# Patient Record
Sex: Male | Born: 1935 | Race: Black or African American | Hispanic: No | Smoking: Never smoker
Health system: Southern US, Community
[De-identification: ages and names within clinical notes are randomized; demographics above are authoritative.]

## PROBLEM LIST (undated history)

## (undated) DIAGNOSIS — E119 Type 2 diabetes mellitus without complications: Secondary | ICD-10-CM

## (undated) DIAGNOSIS — E7800 Pure hypercholesterolemia, unspecified: Secondary | ICD-10-CM

## (undated) DIAGNOSIS — I10 Essential (primary) hypertension: Secondary | ICD-10-CM

## (undated) HISTORY — PX: CHOLECYSTECTOMY: SHX55

---

## 2008-09-01 ENCOUNTER — Inpatient Hospital Stay: Payer: Self-pay | Admitting: Internal Medicine

## 2008-09-04 ENCOUNTER — Emergency Department: Payer: Self-pay | Admitting: Unknown Physician Specialty

## 2008-11-09 ENCOUNTER — Ambulatory Visit: Payer: Self-pay | Admitting: Gastroenterology

## 2012-01-23 LAB — CBC
MCV: 86 fL (ref 80–100)
Platelet: 139 10*3/uL — ABNORMAL LOW (ref 150–440)
RDW: 15.1 % — ABNORMAL HIGH (ref 11.5–14.5)

## 2012-01-23 LAB — URINALYSIS, COMPLETE
Bacteria: NONE SEEN
Bilirubin,UR: NEGATIVE
Glucose,UR: >=500 mg/dL (ref 0–75)
Ketone: NEGATIVE
Nitrite: NEGATIVE
Specific Gravity: 1.017 (ref 1.003–1.030)
Squamous Epithelial: NONE SEEN
WBC UR: 1 /HPF (ref 0–5)

## 2012-01-23 LAB — COMPREHENSIVE METABOLIC PANEL
Albumin: 3.6 g/dL (ref 3.4–5.0)
Anion Gap: 11 (ref 7–16)
BUN: 13 mg/dL (ref 7–18)
Calcium, Total: 9.5 mg/dL (ref 8.5–10.1)
Co2: 24 mmol/L (ref 21–32)
EGFR (African American): >60
EGFR (Non-African Amer.): 55 — ABNORMAL LOW
Potassium: 4.2 mmol/L (ref 3.5–5.1)

## 2012-01-24 ENCOUNTER — Inpatient Hospital Stay: Payer: Self-pay | Admitting: Surgery

## 2012-01-25 LAB — COMPREHENSIVE METABOLIC PANEL
Alkaline Phosphatase: 59 U/L (ref 50–136)
BUN: 18 mg/dL (ref 7–18)
Bilirubin,Total: 0.8 mg/dL (ref 0.2–1.0)
Chloride: 95 mmol/L — ABNORMAL LOW (ref 98–107)
Creatinine: 1.69 mg/dL — ABNORMAL HIGH (ref 0.60–1.30)
EGFR (African American): 45 — ABNORMAL LOW
EGFR (Non-African Amer.): 39 — ABNORMAL LOW
Glucose: 193 mg/dL — ABNORMAL HIGH (ref 65–99)
SGOT(AST): 22 U/L (ref 15–37)
SGPT (ALT): 19 U/L (ref 12–78)
Total Protein: 7 g/dL (ref 6.4–8.2)

## 2012-01-25 LAB — PROTIME-INR
INR: 1.4
Prothrombin Time: 17.7 secs — ABNORMAL HIGH (ref 11.5–14.7)

## 2012-01-25 LAB — CBC WITH DIFFERENTIAL/PLATELET
Basophil %: 0.1 %
Eosinophil %: 0.1 %
HCT: 37.5 % — ABNORMAL LOW (ref 40.0–52.0)
HGB: 12.3 g/dL — ABNORMAL LOW (ref 13.0–18.0)
Lymphocyte %: 7.5 %
Monocyte %: 6.2 %
Neutrophil #: 16.5 10*3/uL — ABNORMAL HIGH (ref 1.4–6.5)
Neutrophil %: 86.1 %
RBC: 4.37 10*6/uL — ABNORMAL LOW (ref 4.40–5.90)

## 2012-01-26 LAB — COMPREHENSIVE METABOLIC PANEL
Anion Gap: 12 (ref 7–16)
BUN: 21 mg/dL — ABNORMAL HIGH (ref 7–18)
Bilirubin,Total: 0.8 mg/dL (ref 0.2–1.0)
Chloride: 92 mmol/L — ABNORMAL LOW (ref 98–107)
Creatinine: 1.63 mg/dL — ABNORMAL HIGH (ref 0.60–1.30)
EGFR (African American): 47 — ABNORMAL LOW
Potassium: 3.8 mmol/L (ref 3.5–5.1)
SGPT (ALT): 15 U/L (ref 12–78)
Total Protein: 6.9 g/dL (ref 6.4–8.2)

## 2012-01-26 LAB — CBC WITH DIFFERENTIAL/PLATELET
Basophil #: 0 10*3/uL (ref 0.0–0.1)
Basophil %: 0.1 %
Eosinophil %: 0.6 %
Lymphocyte #: 1.1 10*3/uL (ref 1.0–3.6)
Lymphocyte %: 6.9 %
MCH: 28.6 pg (ref 26.0–34.0)
MCV: 85 fL (ref 80–100)
Monocyte %: 7.4 %
Neutrophil #: 13.8 10*3/uL — ABNORMAL HIGH (ref 1.4–6.5)
Platelet: 112 10*3/uL — ABNORMAL LOW (ref 150–440)
RBC: 4.02 10*6/uL — ABNORMAL LOW (ref 4.40–5.90)
RDW: 15.1 % — ABNORMAL HIGH (ref 11.5–14.5)
WBC: 16.3 10*3/uL — ABNORMAL HIGH (ref 3.8–10.6)

## 2012-01-27 LAB — CBC WITH DIFFERENTIAL/PLATELET
Basophil %: 0.3 %
Eosinophil %: 2.1 %
HGB: 10.4 g/dL — ABNORMAL LOW (ref 13.0–18.0)
MCH: 27 pg (ref 26.0–34.0)
MCV: 85 fL (ref 80–100)
Monocyte %: 9.3 %
Neutrophil %: 80.5 %
Platelet: 130 10*3/uL — ABNORMAL LOW (ref 150–440)
RBC: 3.84 10*6/uL — ABNORMAL LOW (ref 4.40–5.90)
WBC: 16.1 10*3/uL — ABNORMAL HIGH (ref 3.8–10.6)

## 2012-01-27 LAB — COMPREHENSIVE METABOLIC PANEL
Albumin: 2.3 g/dL — ABNORMAL LOW (ref 3.4–5.0)
Alkaline Phosphatase: 79 U/L (ref 50–136)
BUN: 27 mg/dL — ABNORMAL HIGH (ref 7–18)
Bilirubin,Total: 0.8 mg/dL (ref 0.2–1.0)
Creatinine: 1.76 mg/dL — ABNORMAL HIGH (ref 0.60–1.30)
EGFR (African American): 43 — ABNORMAL LOW
EGFR (Non-African Amer.): 37 — ABNORMAL LOW
Glucose: 155 mg/dL — ABNORMAL HIGH (ref 65–99)
SGOT(AST): 31 U/L (ref 15–37)
SGPT (ALT): 22 U/L (ref 12–78)
Total Protein: 6.7 g/dL (ref 6.4–8.2)

## 2012-01-27 LAB — BASIC METABOLIC PANEL
Anion Gap: 10 (ref 7–16)
BUN: 26 mg/dL — ABNORMAL HIGH (ref 7–18)
Chloride: 98 mmol/L (ref 98–107)
Creatinine: 1.63 mg/dL — ABNORMAL HIGH (ref 0.60–1.30)
EGFR (African American): 47 — ABNORMAL LOW
EGFR (Non-African Amer.): 41 — ABNORMAL LOW
Glucose: 170 mg/dL — ABNORMAL HIGH (ref 65–99)
Osmolality: 264 (ref 275–301)

## 2012-01-27 LAB — AMYLASE: Amylase: 38 U/L (ref 25–115)

## 2012-01-27 LAB — LIPASE, BLOOD: Lipase: 327 U/L (ref 73–393)

## 2012-01-28 LAB — RENAL FUNCTION PANEL
BUN: 28 mg/dL — ABNORMAL HIGH (ref 7–18)
Chloride: 99 mmol/L (ref 98–107)
Creatinine: 1.77 mg/dL — ABNORMAL HIGH (ref 0.60–1.30)
EGFR (African American): 43 — ABNORMAL LOW
EGFR (Non-African Amer.): 37 — ABNORMAL LOW
Glucose: 187 mg/dL — ABNORMAL HIGH (ref 65–99)
Osmolality: 269 (ref 275–301)
Potassium: 3.8 mmol/L (ref 3.5–5.1)
Sodium: 129 mmol/L — ABNORMAL LOW (ref 136–145)

## 2012-01-28 LAB — CBC WITH DIFFERENTIAL/PLATELET
Eosinophil #: 0.4 10*3/uL (ref 0.0–0.7)
Eosinophil %: 2.8 %
Lymphocyte #: 1 10*3/uL (ref 1.0–3.6)
MCH: 29.3 pg (ref 26.0–34.0)
MCHC: 34.3 g/dL (ref 32.0–36.0)
MCV: 86 fL (ref 80–100)
Monocyte #: 1.5 x10 3/mm — ABNORMAL HIGH (ref 0.2–1.0)
Neutrophil %: 78.7 %
Platelet: 136 10*3/uL — ABNORMAL LOW (ref 150–440)
RBC: 3.74 10*6/uL — ABNORMAL LOW (ref 4.40–5.90)
RDW: 15.3 % — ABNORMAL HIGH (ref 11.5–14.5)

## 2012-01-28 LAB — BASIC METABOLIC PANEL
BUN: 28 mg/dL — ABNORMAL HIGH (ref 7–18)
Chloride: 97 mmol/L — ABNORMAL LOW (ref 98–107)
Osmolality: 267 (ref 275–301)
Potassium: 3.7 mmol/L (ref 3.5–5.1)
Sodium: 128 mmol/L — ABNORMAL LOW (ref 136–145)

## 2012-01-28 LAB — PROTEIN / CREATININE RATIO, URINE
Protein, Random Urine: 199 mg/dL — ABNORMAL HIGH (ref 0–12)
Protein/Creat. Ratio: 1316 mg/gCREAT — ABNORMAL HIGH (ref 0–200)

## 2012-01-29 LAB — CBC WITH DIFFERENTIAL/PLATELET
Basophil #: 0 x10 3/mm 3 (ref 0.0–0.1)
Basophil %: 0.2 %
Eosinophil #: 0.4 x10 3/mm 3 (ref 0.0–0.7)
Eosinophil %: 2.8 %
HCT: 30.6 % — ABNORMAL LOW (ref 40.0–52.0)
HGB: 10.3 g/dL — ABNORMAL LOW (ref 13.0–18.0)
Lymphocyte %: 9.8 %
Lymphs Abs: 1.4 x10 3/mm 3 (ref 1.0–3.6)
MCH: 28.7 pg (ref 26.0–34.0)
MCHC: 33.7 g/dL (ref 32.0–36.0)
MCV: 85 fL (ref 80–100)
Monocyte #: 1.9 x10 3/mm — ABNORMAL HIGH (ref 0.2–1.0)
Monocyte %: 13.4 %
Neutrophil #: 10.4 x10 3/mm 3 — ABNORMAL HIGH (ref 1.4–6.5)
Neutrophil %: 73.8 %
Platelet: 169 x10 3/mm 3 (ref 150–440)
RBC: 3.6 x10 6/mm 3 — ABNORMAL LOW (ref 4.40–5.90)
RDW: 15.6 % — ABNORMAL HIGH (ref 11.5–14.5)
WBC: 14.1 x10 3/mm 3 — ABNORMAL HIGH (ref 3.8–10.6)

## 2012-01-29 LAB — COMPREHENSIVE METABOLIC PANEL WITH GFR
Albumin: 2.1 g/dL — ABNORMAL LOW (ref 3.4–5.0)
Alkaline Phosphatase: 133 U/L (ref 50–136)
Anion Gap: 13 (ref 7–16)
BUN: 28 mg/dL — ABNORMAL HIGH (ref 7–18)
Bilirubin,Total: 0.5 mg/dL (ref 0.2–1.0)
Calcium, Total: 7.8 mg/dL — ABNORMAL LOW (ref 8.5–10.1)
Chloride: 99 mmol/L (ref 98–107)
Co2: 19 mmol/L — ABNORMAL LOW (ref 21–32)
Creatinine: 1.74 mg/dL — ABNORMAL HIGH (ref 0.60–1.30)
EGFR (African American): 44 — ABNORMAL LOW
EGFR (Non-African Amer.): 38 — ABNORMAL LOW
Glucose: 171 mg/dL — ABNORMAL HIGH (ref 65–99)
Osmolality: 272 (ref 275–301)
Potassium: 3.4 mmol/L — ABNORMAL LOW (ref 3.5–5.1)
SGOT(AST): 30 U/L (ref 15–37)
SGPT (ALT): 30 U/L (ref 12–78)
Sodium: 131 mmol/L — ABNORMAL LOW (ref 136–145)
Total Protein: 6.6 g/dL (ref 6.4–8.2)

## 2012-01-30 LAB — COMPREHENSIVE METABOLIC PANEL
Anion Gap: 14 (ref 7–16)
BUN: 26 mg/dL — ABNORMAL HIGH (ref 7–18)
Calcium, Total: 7.9 mg/dL — ABNORMAL LOW (ref 8.5–10.1)
Creatinine: 1.79 mg/dL — ABNORMAL HIGH (ref 0.60–1.30)
EGFR (African American): 42 — ABNORMAL LOW
Glucose: 180 mg/dL — ABNORMAL HIGH (ref 65–99)
Potassium: 4.1 mmol/L (ref 3.5–5.1)
SGPT (ALT): 133 U/L — ABNORMAL HIGH (ref 12–78)
Sodium: 133 mmol/L — ABNORMAL LOW (ref 136–145)

## 2012-01-30 LAB — CBC WITH DIFFERENTIAL/PLATELET
Lymphocytes: 11 %
Metamyelocyte: 1 %
Platelet: 210 10*3/uL (ref 150–440)
Segmented Neutrophils: 79 %
Variant Lymphocyte - H1-Rlymph: 1 %
WBC: 13.4 10*3/uL — ABNORMAL HIGH (ref 3.8–10.6)

## 2012-01-31 LAB — CBC WITH DIFFERENTIAL/PLATELET
Eosinophil %: 2.9 %
Lymphocyte #: 1.3 10*3/uL (ref 1.0–3.6)
MCH: 28 pg (ref 26.0–34.0)
MCHC: 32.9 g/dL (ref 32.0–36.0)
MCV: 85 fL (ref 80–100)
Monocyte #: 1.2 x10 3/mm — ABNORMAL HIGH (ref 0.2–1.0)
Platelet: 258 10*3/uL (ref 150–440)
RBC: 3.53 10*6/uL — ABNORMAL LOW (ref 4.40–5.90)
RDW: 15.6 % — ABNORMAL HIGH (ref 11.5–14.5)

## 2012-01-31 LAB — COMPREHENSIVE METABOLIC PANEL
Bilirubin,Total: 3.6 mg/dL — ABNORMAL HIGH (ref 0.2–1.0)
Chloride: 103 mmol/L (ref 98–107)
Co2: 17 mmol/L — ABNORMAL LOW (ref 21–32)
EGFR (African American): 41 — ABNORMAL LOW
EGFR (Non-African Amer.): 35 — ABNORMAL LOW
SGOT(AST): 128 U/L — ABNORMAL HIGH (ref 15–37)
SGPT (ALT): 113 U/L — ABNORMAL HIGH (ref 12–78)

## 2012-02-01 LAB — COMPREHENSIVE METABOLIC PANEL
Albumin: 1.9 g/dL — ABNORMAL LOW (ref 3.4–5.0)
Alkaline Phosphatase: 312 U/L — ABNORMAL HIGH (ref 50–136)
Calcium, Total: 7.6 mg/dL — ABNORMAL LOW (ref 8.5–10.1)
Chloride: 105 mmol/L (ref 98–107)
EGFR (African American): 43 — ABNORMAL LOW
Glucose: 145 mg/dL — ABNORMAL HIGH (ref 65–99)
Potassium: 3.5 mmol/L (ref 3.5–5.1)
SGOT(AST): 91 U/L — ABNORMAL HIGH (ref 15–37)
SGPT (ALT): 99 U/L — ABNORMAL HIGH (ref 12–78)
Total Protein: 5.9 g/dL — ABNORMAL LOW (ref 6.4–8.2)

## 2012-02-02 ENCOUNTER — Inpatient Hospital Stay (HOSPITAL_COMMUNITY)
Admission: AD | Admit: 2012-02-02 | Discharge: 2012-02-16 | DRG: 870 | Disposition: E | Payer: Medicare Other | Source: Other Acute Inpatient Hospital | Attending: Pulmonary Disease | Admitting: Pulmonary Disease

## 2012-02-02 ENCOUNTER — Inpatient Hospital Stay (HOSPITAL_COMMUNITY): Payer: Medicare Other

## 2012-02-02 DIAGNOSIS — E119 Type 2 diabetes mellitus without complications: Secondary | ICD-10-CM | POA: Diagnosis present

## 2012-02-02 DIAGNOSIS — E872 Acidosis, unspecified: Secondary | ICD-10-CM

## 2012-02-02 DIAGNOSIS — G9340 Encephalopathy, unspecified: Secondary | ICD-10-CM | POA: Diagnosis present

## 2012-02-02 DIAGNOSIS — R4182 Altered mental status, unspecified: Secondary | ICD-10-CM

## 2012-02-02 DIAGNOSIS — Z515 Encounter for palliative care: Secondary | ICD-10-CM

## 2012-02-02 DIAGNOSIS — E875 Hyperkalemia: Secondary | ICD-10-CM | POA: Diagnosis present

## 2012-02-02 DIAGNOSIS — N19 Unspecified kidney failure: Secondary | ICD-10-CM

## 2012-02-02 DIAGNOSIS — Z66 Do not resuscitate: Secondary | ICD-10-CM | POA: Diagnosis present

## 2012-02-02 DIAGNOSIS — N179 Acute kidney failure, unspecified: Secondary | ICD-10-CM | POA: Diagnosis present

## 2012-02-02 DIAGNOSIS — A480 Gas gangrene: Secondary | ICD-10-CM | POA: Diagnosis present

## 2012-02-02 DIAGNOSIS — K560 Paralytic ileus: Secondary | ICD-10-CM

## 2012-02-02 DIAGNOSIS — G253 Myoclonus: Secondary | ICD-10-CM

## 2012-02-02 DIAGNOSIS — J9600 Acute respiratory failure, unspecified whether with hypoxia or hypercapnia: Secondary | ICD-10-CM

## 2012-02-02 DIAGNOSIS — R6521 Severe sepsis with septic shock: Secondary | ICD-10-CM | POA: Diagnosis present

## 2012-02-02 DIAGNOSIS — A419 Sepsis, unspecified organism: Principal | ICD-10-CM | POA: Diagnosis present

## 2012-02-02 DIAGNOSIS — G931 Anoxic brain damage, not elsewhere classified: Secondary | ICD-10-CM | POA: Diagnosis present

## 2012-02-02 DIAGNOSIS — Z79899 Other long term (current) drug therapy: Secondary | ICD-10-CM

## 2012-02-02 DIAGNOSIS — E46 Unspecified protein-calorie malnutrition: Secondary | ICD-10-CM | POA: Diagnosis present

## 2012-02-02 DIAGNOSIS — I469 Cardiac arrest, cause unspecified: Secondary | ICD-10-CM

## 2012-02-02 DIAGNOSIS — J690 Pneumonitis due to inhalation of food and vomit: Secondary | ICD-10-CM | POA: Diagnosis present

## 2012-02-02 DIAGNOSIS — Z7982 Long term (current) use of aspirin: Secondary | ICD-10-CM

## 2012-02-02 DIAGNOSIS — D72829 Elevated white blood cell count, unspecified: Secondary | ICD-10-CM | POA: Diagnosis present

## 2012-02-02 HISTORY — DX: Essential (primary) hypertension: I10

## 2012-02-02 HISTORY — DX: Type 2 diabetes mellitus without complications: E11.9

## 2012-02-02 HISTORY — DX: Pure hypercholesterolemia, unspecified: E78.00

## 2012-02-02 LAB — BASIC METABOLIC PANEL
Anion Gap: 10 (ref 7–16)
BUN: 22 mg/dL — ABNORMAL HIGH (ref 7–18)
Calcium, Total: 7.7 mg/dL — ABNORMAL LOW (ref 8.5–10.1)
EGFR (Non-African Amer.): 32 — ABNORMAL LOW
Glucose: 178 mg/dL — ABNORMAL HIGH (ref 65–99)
Osmolality: 282 (ref 275–301)

## 2012-02-02 LAB — COMPREHENSIVE METABOLIC PANEL
ALT: 63 U/L — ABNORMAL HIGH (ref 0–53)
Albumin: 1.7 g/dL — ABNORMAL LOW (ref 3.5–5.2)
Alkaline Phosphatase: 202 U/L — ABNORMAL HIGH (ref 39–117)
Anion Gap: 10 (ref 7–16)
BUN: 26 mg/dL — ABNORMAL HIGH (ref 6–23)
Bilirubin,Total: 1.4 mg/dL — ABNORMAL HIGH (ref 0.2–1.0)
Calcium, Total: 8 mg/dL — ABNORMAL LOW (ref 8.5–10.1)
Chloride: 107 mmol/L (ref 98–107)
Chloride: 101 mEq/L (ref 96–112)
Co2: 19 mmol/L — ABNORMAL LOW (ref 21–32)
EGFR (African American): 50 — ABNORMAL LOW
EGFR (Non-African Amer.): 43 — ABNORMAL LOW
Osmolality: 277 (ref 275–301)
Potassium: 3.9 mmol/L (ref 3.5–5.1)
Potassium: 3.6 mEq/L (ref 3.5–5.1)
Sodium: 136 mmol/L (ref 136–145)
Sodium: 134 mEq/L — ABNORMAL LOW (ref 135–145)
Total Bilirubin: 1.6 mg/dL — ABNORMAL HIGH (ref 0.3–1.2)
Total Protein: 4.8 g/dL — ABNORMAL LOW (ref 6.0–8.3)

## 2012-02-02 LAB — CK TOTAL AND CKMB (NOT AT ARMC)
CK, MB: 3.3 ng/mL (ref 0.3–4.0)
Relative Index: 1.4 (ref 0.0–2.5)
Total CK: 231 U/L (ref 7–232)

## 2012-02-02 LAB — CBC WITH DIFFERENTIAL/PLATELET
Basophils Absolute: 0 10*3/uL (ref 0.0–0.1)
Eosinophil %: 1.1 %
Eosinophils Relative: 1 % (ref 0–5)
HCT: 26.3 % — ABNORMAL LOW (ref 39.0–52.0)
Lymphocytes Relative: 5 % — ABNORMAL LOW (ref 12–46)
Lymphs Abs: 1 10*3/uL (ref 0.7–4.0)
MCH: 28.2 pg (ref 26.0–34.0)
MCV: 81.2 fL (ref 78.0–100.0)
Monocytes Relative: 2 % — ABNORMAL LOW (ref 3–12)
Neutro Abs: 19 10*3/uL — ABNORMAL HIGH (ref 1.7–7.7)
Neutrophil %: 80.8 %
Platelet: 357 10*3/uL (ref 150–440)
Platelets: 258 10*3/uL (ref 150–400)
RBC: 3.44 10*6/uL — ABNORMAL LOW (ref 4.40–5.90)
RBC: 3.24 MIL/uL — ABNORMAL LOW (ref 4.22–5.81)
RDW: 15.4 % (ref 11.5–15.5)
WBC: 20.3 10*3/uL — ABNORMAL HIGH (ref 3.8–10.6)
WBC: 20.6 10*3/uL — ABNORMAL HIGH (ref 4.0–10.5)

## 2012-02-02 LAB — PROTIME-INR: Prothrombin Time: 14.7 seconds (ref 11.6–15.2)

## 2012-02-02 LAB — TROPONIN I: Troponin-I: <0.02 ng/mL

## 2012-02-02 LAB — GLUCOSE, CAPILLARY: Glucose-Capillary: 169 mg/dL — ABNORMAL HIGH (ref 70–99)

## 2012-02-02 LAB — LIPASE, BLOOD: Lipase: 401 U/L — ABNORMAL HIGH (ref 73–393)

## 2012-02-02 LAB — POCT I-STAT 3, ART BLOOD GAS (G3+)
Bicarbonate: 18.9 mEq/L — ABNORMAL LOW (ref 20.0–24.0)
O2 Saturation: 98 %
TCO2: 20 mmol/L (ref 0–100)
pCO2 arterial: 33.4 mmHg — ABNORMAL LOW (ref 35.0–45.0)
pH, Arterial: 7.361 (ref 7.350–7.450)
pO2, Arterial: 99 mmHg (ref 80.0–100.0)

## 2012-02-02 LAB — CK: CK, Total: 77 U/L (ref 35–232)

## 2012-02-02 LAB — CK-MB: CK-MB: 1.1 ng/mL (ref 0.5–3.6)

## 2012-02-02 LAB — PHOSPHORUS: Phosphorus: 2.1 mg/dL — ABNORMAL LOW (ref 2.3–4.6)

## 2012-02-02 LAB — PRO B NATRIURETIC PEPTIDE: Pro B Natriuretic peptide (BNP): 2816 pg/mL — ABNORMAL HIGH (ref 0–125)

## 2012-02-02 MED ORDER — FLUCONAZOLE IN SODIUM CHLORIDE 200-0.9 MG/100ML-% IV SOLN
200.0000 mg | INTRAVENOUS | Status: DC
  Administered 2012-02-03 – 2012-02-06 (×4): 200 mg via INTRAVENOUS
  Filled 2012-02-02 (×5): qty 100

## 2012-02-02 MED ORDER — CHLORHEXIDINE GLUCONATE 0.12 % MT SOLN
OROMUCOSAL | Status: AC
  Administered 2012-02-02: 21:00:00
  Filled 2012-02-02: qty 15

## 2012-02-02 MED ORDER — SODIUM CHLORIDE 0.9 % IV SOLN
INTRAVENOUS | Status: DC
  Administered 2012-02-02: 20:00:00 via INTRAVENOUS

## 2012-02-02 MED ORDER — SODIUM CHLORIDE 0.9 % IV SOLN
25.0000 ug/h | INTRAVENOUS | Status: DC
  Administered 2012-02-02 – 2012-02-05 (×3): 50 ug/h via INTRAVENOUS
  Filled 2012-02-02 (×2): qty 50

## 2012-02-02 MED ORDER — NOREPINEPHRINE BITARTRATE 1 MG/ML IJ SOLN
2.0000 ug/min | INTRAVENOUS | Status: DC
  Administered 2012-02-02: 22 ug/min via INTRAVENOUS
  Filled 2012-02-02 (×2): qty 4

## 2012-02-02 MED ORDER — SODIUM CHLORIDE 0.9 % IV SOLN
INTRAVENOUS | Status: DC
  Administered 2012-02-02 – 2012-02-03 (×2): via INTRAVENOUS

## 2012-02-02 MED ORDER — VALPROATE SODIUM 500 MG/5ML IV SOLN: 10.0000 mg/kg/d | Freq: Two times a day (BID) | INTRAVENOUS | Status: DC

## 2012-02-02 MED ORDER — PROPOFOL 10 MG/ML IV EMUL
5.0000 ug/kg/min | INTRAVENOUS | Status: DC
  Administered 2012-02-02: 50 ug/kg/min via INTRAVENOUS
  Filled 2012-02-02: qty 100

## 2012-02-02 MED ORDER — VANCOMYCIN HCL 1000 MG IV SOLR
2500.0000 mg | Freq: Once | INTRAVENOUS | Status: AC
  Administered 2012-02-02: 2500 mg via INTRAVENOUS
  Filled 2012-02-02: qty 2500

## 2012-02-02 MED ORDER — LIDOCAINE HCL (CARDIAC) 20 MG/ML IV SOLN
INTRAVENOUS | Status: AC
  Filled 2012-02-02: qty 5

## 2012-02-02 MED ORDER — ETOMIDATE 2 MG/ML IV SOLN
INTRAVENOUS | Status: AC
  Filled 2012-02-02: qty 20

## 2012-02-02 MED ORDER — PANTOPRAZOLE SODIUM 40 MG IV SOLR
40.0000 mg | INTRAVENOUS | Status: DC
  Administered 2012-02-02 – 2012-02-07 (×6): 40 mg via INTRAVENOUS
  Filled 2012-02-02 (×7): qty 40

## 2012-02-02 MED ORDER — INSULIN ASPART 100 UNIT/ML ~~LOC~~ SOLN
2.0000 [IU] | SUBCUTANEOUS | Status: DC
  Administered 2012-02-02 (×2): 4 [IU] via SUBCUTANEOUS
  Administered 2012-02-03 (×2): 2 [IU] via SUBCUTANEOUS
  Administered 2012-02-03: 4 [IU] via SUBCUTANEOUS
  Administered 2012-02-03 – 2012-02-08 (×6): 2 [IU] via SUBCUTANEOUS

## 2012-02-02 MED ORDER — CHLORHEXIDINE GLUCONATE 0.12 % MT SOLN
15.0000 mL | Freq: Two times a day (BID) | OROMUCOSAL | Status: DC
  Administered 2012-02-02 – 2012-02-08 (×13): 15 mL via OROMUCOSAL
  Filled 2012-02-02 (×10): qty 15

## 2012-02-02 MED ORDER — CHLORHEXIDINE GLUCONATE 0.12 % MT SOLN
15.0000 mL | Freq: Two times a day (BID) | OROMUCOSAL | Status: DC
  Administered 2012-02-02: 15 mL via OROMUCOSAL

## 2012-02-02 MED ORDER — SODIUM CHLORIDE 0.9 % IV SOLN: 500.0000 mg | Freq: Two times a day (BID) | INTRAVENOUS | Status: DC

## 2012-02-02 MED ORDER — NOREPINEPHRINE BITARTRATE 1 MG/ML IJ SOLN
2.0000 ug/min | INTRAVENOUS | Status: DC
  Administered 2012-02-03: 22 ug/min via INTRAVENOUS
  Administered 2012-02-03: 10 ug/min via INTRAVENOUS
  Administered 2012-02-04: 9 ug/min via INTRAVENOUS
  Administered 2012-02-05: 3 ug/min via INTRAVENOUS
  Filled 2012-02-02 (×4): qty 16

## 2012-02-02 MED ORDER — VANCOMYCIN HCL 1000 MG IV SOLR
1500.0000 mg | INTRAVENOUS | Status: DC
  Administered 2012-02-03: 1500 mg via INTRAVENOUS
  Filled 2012-02-02 (×2): qty 1500

## 2012-02-02 MED ORDER — SUCCINYLCHOLINE CHLORIDE 20 MG/ML IJ SOLN
INTRAMUSCULAR | Status: AC
  Filled 2012-02-02: qty 1

## 2012-02-02 MED ORDER — VANCOMYCIN HCL 1000 MG IV SOLR
2500.0000 mg | Freq: Once | INTRAVENOUS | Status: DC
  Filled 2012-02-02: qty 2500

## 2012-02-02 MED ORDER — SODIUM CHLORIDE 0.9 % IV SOLN
1000.0000 mg | Freq: Once | INTRAVENOUS | Status: AC
  Administered 2012-02-02: 1000 mg via INTRAVENOUS
  Filled 2012-02-02: qty 10

## 2012-02-02 MED ORDER — BIOTENE DRY MOUTH MT LIQD: 15.0000 mL | Freq: Two times a day (BID) | OROMUCOSAL | Status: DC

## 2012-02-02 MED ORDER — BIOTENE DRY MOUTH MT LIQD
15.0000 mL | Freq: Four times a day (QID) | OROMUCOSAL | Status: DC
  Administered 2012-02-02 – 2012-02-08 (×24): 15 mL via OROMUCOSAL

## 2012-02-02 MED ORDER — PIPERACILLIN-TAZOBACTAM 3.375 G IVPB
3.3750 g | Freq: Three times a day (TID) | INTRAVENOUS | Status: DC
  Administered 2012-02-02 – 2012-02-06 (×11): 3.375 g via INTRAVENOUS
  Filled 2012-02-02 (×13): qty 50

## 2012-02-02 MED ORDER — SODIUM CHLORIDE 0.9 % IV SOLN
1.0000 mg/h | INTRAVENOUS | Status: DC
  Administered 2012-02-02: 10 mg/h via INTRAVENOUS
  Administered 2012-02-03 – 2012-02-05 (×9): 8 mg/h via INTRAVENOUS
  Filled 2012-02-02 (×10): qty 10

## 2012-02-02 MED ORDER — ROCURONIUM BROMIDE 50 MG/5ML IV SOLN
INTRAVENOUS | Status: AC
  Administered 2012-02-02: 100 mg
  Filled 2012-02-02: qty 2

## 2012-02-02 MED ORDER — FLUCONAZOLE IN SODIUM CHLORIDE 400-0.9 MG/200ML-% IV SOLN
400.0000 mg | Freq: Once | INTRAVENOUS | Status: DC
  Administered 2012-02-02: 400 mg via INTRAVENOUS
  Filled 2012-02-02: qty 200

## 2012-02-02 NOTE — Procedures (Signed)
Arterial Catheter Insertion Procedure Note Trapper Meech [id number] 03-05-1936  Procedure: Insertion of Arterial Catheter  Indications: Blood pressure monitoring  Procedure Details Consent: Risks of procedure as well as the alternatives and risks of each were explained to the (patient/caregiver).  Consent for procedure obtained. Time Out: Verified patient identification, verified procedure, site/side was marked, verified correct patient position, special equipment/implants available, medications/allergies/relevent history reviewed, required imaging and test results available.  Performed  Maximum sterile technique was used including antiseptics, cap, gloves, gown, hand hygiene, mask and sheet. Skin prep: Chlorhexidine; local anesthetic administered 20 gauge catheter was inserted into right radial artery using the Seldinger technique.  Evaluation Blood flow good; BP tracing good. Complications: No apparent complications.   YACOUB,WESAM 02/10/2012

## 2012-02-02 NOTE — Progress Notes (Signed)
MEDICATION RELATED CONSULT NOTE - INITIAL   Pharmacy Consult for Valproate Indication: Myoclonus of face  Allergies  Allergen Reactions  . Ace Inhibitors Swelling    Patient Measurements: Height: 6\' 3"  (190.5 cm) Weight: 258 lb 9.6 oz (117.3 kg) IBW/kg (Calculated) : 84.5   Vital Signs: Temp: 97.7 F (36.5 C) (11/17 1930) Temp src: Oral (11/17 1930) BP: 114/64 mmHg (11/17 2311) Pulse Rate: 59  (11/17 2311) Intake/Output from previous day:   Intake/Output from this shift: Total I/O In: 894.3 [I.V.:559.3; IV Piggyback:335] Out: 620 [Urine:20; Emesis/NG output:600]  Labs:  [facility] 02/14/2012 1820  WBC 20.6*  HGB 9.1*  HCT 26.3*  PLT 258  APTT 28  CREATININE 2.44*  LABCREA --  CREATININE 2.44*  CREAT24HRUR --  MG 2.0  PHOS 2.1*  ALBUMIN 1.7*  PROT 4.8*  ALBUMIN 1.7*  AST 49*  ALT 63*  ALKPHOS 202*  BILITOT 1.6*  BILIDIR --  IBILI --   Estimated Creatinine Clearance: 36.7 ml/min (by C-G formula based on Cr of 2.44).   Microbiology: Recent Results (from the past 720 hour(s))  MRSA PCR SCREENING     Status: Normal   Collection Time   01/25/2012  5:56 PM      Component Value Range Status Comment   MRSA by PCR NEGATIVE  NEGATIVE Final     Medical History: No past medical history on file.  Medications:  Scheduled:    . antiseptic oral rinse  15 mL Mouth Rinse QID  . chlorhexidine  15 mL Mouth Rinse BID  . [COMPLETED] chlorhexidine      . etomidate      . [COMPLETED] fluconazole (DIFLUCAN) IV  400 mg Intravenous Once   Followed by  . fluconazole (DIFLUCAN) IV  200 mg Intravenous Q24H  . insulin aspart  2-6 Units Subcutaneous Q4H  . [COMPLETED] levetiracetam  1,000 mg Intravenous Once  . levetiracetam  500 mg Intravenous Q12H  . lidocaine (cardiac) 100 mg/43ml      . pantoprazole (PROTONIX) IV  40 mg Intravenous Q24H  . piperacillin-tazobactam (ZOSYN)  IV  3.375 g Intravenous Q8H  . [COMPLETED] rocuronium      . succinylcholine      . valproate  sodium  10 mg/kg/day Intravenous Q12H  . vancomycin  1,500 mg Intravenous Q24H  . vancomycin  2,500 mg Intravenous Once  . vancomycin  2,500 mg Intravenous Once  . [DISCONTINUED] antiseptic oral rinse  15 mL Mouth Rinse q12n4p  . [DISCONTINUED] chlorhexidine  15 mL Mouth Rinse BID    Assessment: 76 yo male known to pharmacy from antimicrobial management. Pharmacy now consulted to manage valproate for myoclonic activity of face despite receiving Keppra.   Goal of Therapy:  Valproate trough 50-100 mcg/mL  Plan:  1. Valproate 1750mg  x 1, then 400mg  IV Q8H 2. Consider valproate level once at steady-state (~4 days)  Emeline Gins 01/24/2012,11:57 PM

## 2012-02-02 NOTE — H&P (Signed)
Name: Jeremiah Rivera MRN: [id number] DOB: 1936/01/12    LOS: 0  Referring Provider:  [facility] surgery. Reason for Referral:  Cardiac arrest and respiratory failure  PULMONARY / CRITICAL CARE MEDICINE  HPI:  76 year old presented to [facility] 10 days ago with a gangrenous galbladder that was removed followed by ERCP.  The patient was admitted to the floor where he had a presumed aspiration event and cardiac arrest.  Code x12 minutes with subsequent myoclonus.  At the family's request patient was transferred to [facility].  No past medical history on file. No past surgical history on file. Prior to Admission medications   Medication Sig Start Date End Date Taking? Authorizing Provider  Amlodipine-Valsartan-HCTZ (EXFORGE HCT) 10-320-25 MG TABS Take 1 tablet by mouth daily.   Yes Historical Provider, MD  aspirin 81 MG tablet Take 81 mg by mouth daily.   Yes Historical Provider, MD  carvedilol (COREG) 25 MG tablet Take 50 mg by mouth 2 (two) times daily with a meal.    Yes Historical Provider, MD  chlorproMAZINE (THORAZINE) 50 MG tablet Take 50 mg by mouth 3 (three) times daily.   Yes Historical Provider, MD  cloNIDine (CATAPRES) 0.2 MG tablet Take 0.2 mg by mouth 2 (two) times daily.   Yes Historical Provider, MD  pioglitazone-metformin (ACTOPLUS MET) 15-850 MG per tablet Take 1 tablet by mouth 3 (three) times daily.   Yes Historical Provider, MD  sitaGLIPtin (JANUVIA) 50 MG tablet Take 50 mg by mouth daily.   Yes Historical Provider, MD   Allergies Allergies  Allergen Reactions  . Ace Inhibitors Swelling    Family History No family history on file. Social History  does not have a smoking history on file. He does not have any smokeless tobacco history on file. His alcohol and drug histories not on file.  Review Of Systems:  Unattainable  Events Since Admission: Cardiac arrest 11/17 Transfer to [facility] 11/17  Current Status:  Vital Signs: FiO2 (%):  [100 %] 100 % (11/17 1729) Weight:  [117.3 kg  (258 lb 9.6 oz)] 117.3 kg (258 lb 9.6 oz) (11/17 1729)  Physical Examination: General:  Obese male with distended abdomen. Neuro:  Myoclonus, no corneal, minimal gag, no dolls eye. HEENT:  [place]/AT, EOM-I spontaneous with myoclonus. Neck:  Supple, -LAN and -thyromegally. Cardiovascular:  RRR, Nl S1/S2, -M/R/G. Lungs:  Coarse BS diffusely. Abdomen:  Distended, hypertympanic, -BS. Musculoskeletal:  -edema and -tenderness. Skin:  Intact.  Active Problems:  Acute respiratory failure  Metabolic acidosis  Anoxic brain injury  Altered mental status  Cardiac arrest  Myoclonus  Renal failure   ASSESSMENT AND PLAN  PULMONARY  Lab February 29, 2012 1812  PHART 7.361  PCO2ART 33.4*  PO2ART 99.0  HCO3 18.9*  O2SAT 98.0   Ventilator Settings: Vent Mode:  [-] PRVC FiO2 (%):  [100 %] 100 % Set Rate:  [16 bmp] 16 bmp Vt Set:  [580 mL] 580 mL PEEP:  [5 cmH20-10 cmH20] 10 cmH20 CXR:  ET tube ok, TLC OK. ETT:  11/17>>>  A:  Respiratory failure secondary to aspiration resulting in cardiac arrest. P:   Full vent support. Increase PEEP to 10, PaO2 of 100 on 100%. Suction and lavage as needed. ABG and CXR in AM.  CARDIOVASCULAR No results found for this basename: TROPONINI:5,LATICACIDVEN:5, O2SATVEN:5,PROBNP:5 in the last 168 hours ECG:  Pending. Lines: R radial a-line 11/17>>>  L [place] TLC 11/17>>>  A: Shock due to propofol requiring low dose dopamine, hypertensive off propofol but will attempt to  keep on propofol until evaluated by neuro. P:  Propofol as discussed above. CVP. IVF. 2D echo. EKG. Not a candidate for hypothermia given sepsis and presumed evolving septic shock from abdominal source as well as aspiration.  RENAL No results found for this basename: NA:5,K:2,CL:5,CO2:5,BUN:5,CREATININE:5,CALCIUM:5,MG:5,PHOS:5 in the last 168 hours Intake/Output    None    Foley:  In place from [facility].  A:  Acute renal failure presumed, baseline unknown. P:   Monitor electrolytes. Replace  as needed. Gentle hydration.  GASTROINTESTINAL No results found for this basename: AST:5,ALT:5,ALKPHOS:5,BILITOT:5,PROT:5,ALBUMIN:5 in the last 168 hours  A:  Distended post a gangrenous galbladder. P:   Surgery's input appreciated. AXR noted with ileus. Will keep NPO. Monitor.  HEMATOLOGIC  Lab 2012-02-15 1820  HGB 9.1*  HCT 26.3*  PLT 258  INR --  APTT --   A:  Leukocytosis likely from sepsis. P:  Monitor CBC No need for transfusion at this time.  INFECTIOUS  Lab 2012/02/15 1820  WBC 20.6*  PROCALCITON --   Cultures: Blood 11/17>>> Urine 11/17>>> Sputum 11/17>>> Antibiotics: Vanc 11/17>>> Zosyn 11/17>>> Diflucan 11/17>>>  A:  Abdominal source and aspiration PNA. P:   Abx as above. Will tailor for cultures. If BP worsen will consider sepsis protocol, will attempt to avoid that for now to avoid over hydration.  ENDOCRINE No results found for this basename: GLUCAP:5 in the last 168 hours A:  Diabetes history.   P:   ISS CBG  NEUROLOGIC  A:  Likely severe anoxic injury. P:   Will CT in AM pending neuro evaluation. EEG pending neuro evaluation.  BEST PRACTICE / DISPOSITION Level of Care:  ICU Primary Service:  PCCM Consultants:  Surgery and Neuro Code Status:  Full Diet:  NPO DVT Px:  SCD's until neuro status is evaluated.  Head CT from [facility] on 11/17 is negative. GI Px:  Protonix. Skin Integrity:  Intact. Social / Family:  Family updated extensively and all questions answered.  CC time 85 min.  Koren Bound, M.D. Pulmonary and Critical Care Medicine [facility] Pager: [phone]  02-15-12, 6:58 PM

## 2012-02-02 NOTE — Procedures (Signed)
Central Venous Catheter Insertion Procedure Note Audric Weidner [id number] 04-06-1935  Procedure: Insertion of Central Venous Catheter Indications: Assessment of intravascular volume, Drug and/or fluid administration and Frequent blood sampling  Procedure Details Consent: Risks of procedure as well as the alternatives and risks of each were explained to the (patient/caregiver).  Consent for procedure obtained. Time Out: Verified patient identification, verified procedure, site/side was marked, verified correct patient position, special equipment/implants available, medications/allergies/relevent history reviewed, required imaging and test results available.  Performed  Maximum sterile technique was used including antiseptics, cap, gloves, gown, hand hygiene, mask and sheet. Skin prep: Chlorhexidine; local anesthetic administered A antimicrobial bonded/coated triple lumen catheter was placed in the left subclavian vein using the Seldinger technique.  Evaluation Blood flow good Complications: No apparent complications Patient did tolerate procedure well. Chest X-ray ordered to verify placement.  CXR: pending.  U/S used in placement, picture in chart.  Britanny Marksberry 01/22/2012, 6:07 PM

## 2012-02-02 NOTE — Progress Notes (Signed)
eLink Physician-Brief Progress Note Patient Name: Jeremiah Rivera DOB: 1935-10-15 MRN: [id number]  Date of Service  02/05/2012   HPI/Events of Note   Myoclonic activity - twitching of left face continues inspite of loading with keppra & 8 mg versed  eICU Interventions  Add valproate EEG   Intervention Category Major Interventions: Seizures - evaluation and management  Lenin Kuhnle V. 01/26/2012, 11:44 PM

## 2012-02-02 NOTE — Consult Note (Signed)
Reason for Consult:abdominal distension s/p lap chole at [facility] Referring Physician: Mikie Misner is an 76 y.o. male.  HPI: Pt 3 days s/p lap chole and post op ERCP  For CBD stone from [place]. Had an aspiration event and PEA cardiac arrest at [facility] for unknown time.  Intubated  At this point on pressors.  No test to review.  NGT in place.   Labs pending.  No past medical history on file.  No past surgical history on file.  No family history on file.  Social History:  does not have a smoking history on file. He does not have any smokeless tobacco history on file. His alcohol and drug histories not on file.  Allergies:  Allergies  Allergen Reactions  . Ace Inhibitors Swelling    Medications: I have reviewed the patient's current medications.  Results for orders placed during the hospital encounter of 01/23/2012 (from the past 48 hour(s))  POCT I-STAT 3, BLOOD GAS (G3+)     Status: Abnormal   Collection Time   02/10/2012  6:12 PM      Component Value Range Comment   pH, Arterial 7.361  7.350 - 7.450    pCO2 arterial 33.4 (*) 35.0 - 45.0 mmHg    pO2, Arterial 99.0  80.0 - 100.0 mmHg    Bicarbonate 18.9 (*) 20.0 - 24.0 mEq/L    TCO2 20  0 - 100 mmol/L    O2 Saturation 98.0      Acid-base deficit 6.0 (*) 0.0 - 2.0 mmol/L    Patient temperature 98.2 F      Collection site RADIAL, ALLEN'S TEST ACCEPTABLE      Drawn by Operator      Sample type ARTERIAL       No results found.  Review of Systems  Unable to perform ROS  Weight 258 lb 9.6 oz (117.3 kg). Physical Exam  Constitutional:       Intubated   HENT:  Head: Normocephalic and atraumatic.  Cardiovascular: Normal rate and regular rhythm.   Respiratory: Effort normal and breath sounds normal.  GI: He exhibits distension.       Distended but not rigid.  JP drain in place showing serosanguinous   No bile, stool or succus.  quiet  Skin: Skin is warm and dry.    Assessment/Plan: Abdominal distension with  PEA after probable aspiration.  CT not useful without contrast but cr 2 and has already had contrast at [facility] and this bumped his cr.  The pt will have free air and fluid at this point.  The drain is probably the best indicator of intraabdominal issues.  The pt had a history of abdominal distension prior to surgery.  Continue supportive care for now.  The distension is not new and is exacerbated secondary to CPR and PEA event.  May benefit from imaging at some point but given history of aspiration this is probably the event.  Will follow for now.  Shantal Roan A. 01/27/2012, 6:39 PM

## 2012-02-02 NOTE — Progress Notes (Signed)
ANTIBIOTIC CONSULT NOTE - INITIAL  Pharmacy Consult for Vancomycin/Zosyn/Diflucan Indication: pna vs. Asp pna. Vs abd infection  Allergies  Allergen Reactions  . Ace Inhibitors Swelling    Patient Measurements: Height: 6\' 3"  (190.5 cm) Weight: 258 lb 9.6 oz (117.3 kg) IBW/kg (Calculated) : 84.5   Vital Signs:   Intake/Output from previous day:   Intake/Output from this shift:    Labs:  Basename 01/26/2012 1820  WBC 20.6*  HGB 9.1*  PLT 258  LABCREA --  CREATININE 2.44*   Estimated Creatinine Clearance: 36.7 ml/min (by C-G formula based on Cr of 2.44). No results found for this basename: VANCOTROUGH:2,VANCOPEAK:2,VANCORANDOM:2,GENTTROUGH:2,GENTPEAK:2,GENTRANDOM:2,TOBRATROUGH:2,TOBRAPEAK:2,TOBRARND:2,AMIKACINPEAK:2,AMIKACINTROU:2,AMIKACIN:2, in the last 72 hours   Microbiology: No results found for this or any previous visit (from the past 720 hour(s)).  Medical History: No past medical history on file.  Medications:  Scheduled:    . etomidate      . lidocaine (cardiac) 100 mg/73ml      . pantoprazole (PROTONIX) IV  40 mg Intravenous Q24H  . rocuronium      . succinylcholine       Infusions:    . sodium chloride    . fentaNYL infusion INTRAVENOUS    . norepinephrine (LEVOPHED) Adult infusion    . propofol     Assessment: 76 y/o male patient transferred from [place] s/p aspiration and cardiac arrest event requiring broad spectrum antibiotics for r/o pna vs intra-abdominal infection. Noted elevated scr, will adjust abx doses.  Goal of Therapy:  Vancomycin trough level 15-20 mcg/ml  Plan:  Vancomycin 2500mg  IV load x1 then 1500mg  IV q24, zosyn 3.375g IV q24 and diflucan 400mg  IV x1 then 200mg  IV q24h. Will monitor renal function and f/u c&s. Measure antibiotic drug levels at steady state  [place], [place] M 02/13/2012,7:11 PM

## 2012-02-03 ENCOUNTER — Inpatient Hospital Stay (HOSPITAL_COMMUNITY): Payer: Medicare Other

## 2012-02-03 ENCOUNTER — Encounter (HOSPITAL_COMMUNITY): Payer: Self-pay | Admitting: *Deleted

## 2012-02-03 LAB — CORTISOL: Cortisol, Plasma: 23.2 ug/dL

## 2012-02-03 LAB — CBC
HCT: 25.2 % — ABNORMAL LOW (ref 39.0–52.0)
HCT: 23.4 % — ABNORMAL LOW (ref 39.0–52.0)
Hemoglobin: 8.6 g/dL — ABNORMAL LOW (ref 13.0–17.0)
MCHC: 34.1 g/dL (ref 30.0–36.0)
MCHC: 35.5 g/dL (ref 30.0–36.0)
MCV: 80.5 fL (ref 78.0–100.0)
MCV: 79.6 fL (ref 78.0–100.0)
Platelets: 170 10*3/uL (ref 150–400)
RDW: 15.6 % — ABNORMAL HIGH (ref 11.5–15.5)
WBC: 26.3 10*3/uL — ABNORMAL HIGH (ref 4.0–10.5)
WBC: 23.9 10*3/uL — ABNORMAL HIGH (ref 4.0–10.5)

## 2012-02-03 LAB — POCT I-STAT 3, ART BLOOD GAS (G3+)
Acid-base deficit: 8 mmol/L — ABNORMAL HIGH (ref 0.0–2.0)
Bicarbonate: 18 mEq/L — ABNORMAL LOW (ref 20.0–24.0)
Bicarbonate: 17.7 mEq/L — ABNORMAL LOW (ref 20.0–24.0)
O2 Saturation: 82 %
TCO2: 19 mmol/L (ref 0–100)
TCO2: 19 mmol/L (ref 0–100)
pCO2 arterial: 31.6 mmHg — ABNORMAL LOW (ref 35.0–45.0)
pCO2 arterial: 34.6 mmHg — ABNORMAL LOW (ref 35.0–45.0)
pH, Arterial: 7.365 (ref 7.350–7.450)
pO2, Arterial: 49 mmHg — ABNORMAL LOW (ref 80.0–100.0)

## 2012-02-03 LAB — GLUCOSE, CAPILLARY
Glucose-Capillary: 136 mg/dL — ABNORMAL HIGH (ref 70–99)
Glucose-Capillary: 114 mg/dL — ABNORMAL HIGH (ref 70–99)
Glucose-Capillary: 132 mg/dL — ABNORMAL HIGH (ref 70–99)

## 2012-02-03 LAB — COMPREHENSIVE METABOLIC PANEL
AST: 50 U/L — ABNORMAL HIGH (ref 0–37)
Albumin: 1.6 g/dL — ABNORMAL LOW (ref 3.5–5.2)
BUN: 31 mg/dL — ABNORMAL HIGH (ref 6–23)
Chloride: 105 mEq/L (ref 96–112)
Creatinine, Ser: 3.23 mg/dL — ABNORMAL HIGH (ref 0.50–1.35)
Potassium: 3.8 mEq/L (ref 3.5–5.1)
Total Protein: 5.4 g/dL — ABNORMAL LOW (ref 6.0–8.3)

## 2012-02-03 LAB — BASIC METABOLIC PANEL
BUN: 27 mg/dL — ABNORMAL HIGH (ref 6–23)
Chloride: 100 mEq/L (ref 96–112)
Creatinine, Ser: 2.64 mg/dL — ABNORMAL HIGH (ref 0.50–1.35)
Glucose, Bld: 200 mg/dL — ABNORMAL HIGH (ref 70–99)
Potassium: 3.9 mEq/L (ref 3.5–5.1)

## 2012-02-03 LAB — TROPONIN I
Troponin I: 0.35 ng/mL (ref <0.30)
Troponin I: 0.63 ng/mL (ref <0.30)

## 2012-02-03 LAB — POCT I-STAT, CHEM 8
BUN: 28 mg/dL — ABNORMAL HIGH (ref 6–23)
Chloride: 106 mEq/L (ref 96–112)
HCT: 27 % — ABNORMAL LOW (ref 39.0–52.0)
Potassium: 4.3 mEq/L (ref 3.5–5.1)
Sodium: 137 mEq/L (ref 135–145)

## 2012-02-03 MED ORDER — SODIUM CHLORIDE 0.9 % IV SOLN
1000.0000 mg | Freq: Once | INTRAVENOUS | Status: AC
  Administered 2012-02-03: 1000 mg via INTRAVENOUS
  Filled 2012-02-03: qty 10

## 2012-02-03 MED ORDER — SODIUM CHLORIDE 0.9 % IV SOLN
1500.0000 mg | Freq: Two times a day (BID) | INTRAVENOUS | Status: DC
  Administered 2012-02-03 – 2012-02-08 (×10): 1500 mg via INTRAVENOUS
  Filled 2012-02-03 (×11): qty 15

## 2012-02-03 MED ORDER — VALPROATE SODIUM 500 MG/5ML IV SOLN
400.0000 mg | Freq: Three times a day (TID) | INTRAVENOUS | Status: DC
  Administered 2012-02-03 – 2012-02-04 (×4): 400 mg via INTRAVENOUS
  Filled 2012-02-03 (×5): qty 4

## 2012-02-03 MED ORDER — VALPROATE SODIUM 500 MG/5ML IV SOLN
1750.0000 mg | Freq: Once | INTRAVENOUS | Status: AC
  Administered 2012-02-03: 1750 mg via INTRAVENOUS
  Filled 2012-02-03: qty 17.5

## 2012-02-03 MED ORDER — SODIUM CHLORIDE 0.9 % IV SOLN: 1000.0000 mg | Freq: Two times a day (BID) | INTRAVENOUS | Status: DC

## 2012-02-03 MED ORDER — VASOPRESSIN 20 UNIT/ML IJ SOLN
0.0300 [IU]/min | INTRAVENOUS | Status: DC
  Administered 2012-02-03 – 2012-02-04 (×2): 0.03 [IU]/min via INTRAVENOUS
  Filled 2012-02-03 (×3): qty 2.5

## 2012-02-03 MED ORDER — SODIUM CHLORIDE 0.9 % IV SOLN
750.0000 mg | Freq: Two times a day (BID) | INTRAVENOUS | Status: DC
  Administered 2012-02-03: 750 mg via INTRAVENOUS
  Filled 2012-02-03 (×2): qty 7.5

## 2012-02-03 NOTE — Progress Notes (Signed)
Name: Montique Betzen MRN: [id number] DOB: 09-Jan-1936    LOS: 2  Referring Provider:  [facility] surgery. Reason for Referral:  Cardiac arrest and respiratory failure  PULMONARY / CRITICAL CARE MEDICINE  HPI:  76 year old presented to [facility] 10 days ago with a gangrenous galbladder that was removed followed by ERCP.  The patient was admitted to the floor where he had a presumed aspiration event and cardiac arrest.  Code x12 minutes with subsequent myoclonus.  At the family's request patient was transferred to [facility].  Past Medical History  Diagnosis Date  . Hypertension   . Diabetes mellitus without complication   . Hypercholesterolemia    Past Surgical History  Procedure Date  . Cholecystectomy     Review Of Systems:  Unattainable  Events Since Admission: 11/17 Cardiac arrest and transfer to [facility] 11/18 CT of Head, EEG showing severe hypoxic encephalopathy  Current Status: Critical  Vital Signs: Temp:  [97.1 F (36.2 C)-98.2 F (36.8 C)] 98.2 F (36.8 C) (11/19 0800) Pulse Rate:  [54-66] 62  (11/19 0851) Resp:  [18-32] 23  (11/19 0851) BP: (93-138)/(52-87) 120/58 mmHg (11/19 0851) SpO2:  [90 %-100 %] 99 % (11/19 0851) FiO2 (%):  [90 %-100 %] 90 % (11/19 0851) CVP:  [18 mmHg-23 mmHg] 23 mmHg   Physical Examination: General:  Obese male with distended abdomen. Neuro:  No corneal, no gag, no cough HEENT:  [place]/AT, EOM-I spontaneous  Neck:  Supple, -LAN and -thyromegally. Cardiovascular:  RRR, Nl S1/S2, -M/R/G., pulses +2 Lungs:  Coarse BS diffusely. Abdomen:  Distended, hypertympanic, -BS. Musculoskeletal:  -edema and -tenderness. Skin:  Intact.  Active Problems:  Acute respiratory failure  Metabolic acidosis  Anoxic brain injury  Altered mental status  Cardiac arrest  Myoclonus  Renal failure  Anoxic brain damage   ASSESSMENT AND PLAN  PULMONARY  Lab 02/04/12 0349 02/03/12 2021 02/03/12 0235 01/25/2012 2120 01/28/2012 1812  PHART 7.303* 7.319* 7.365 -- 7.361    PCO2ART 36.3 34.6* 31.6* -- 33.4*  PO2ART 112.0* 49.0* 66.0* -- 99.0  HCO3 17.5* 17.7* 18.0* -- 18.9*  O2SAT 97.3 82.0 92.0 76.2 98.0   Ventilator Settings: Vent Mode:  [-] PRVC FiO2 (%):  [90 %-100 %] 90 % Set Rate:  [15 bmp-30 bmp] 30 bmp Vt Set:  [450 mL-580 mL] 450 mL PEEP:  [10 cmH20-20 cmH20] 18 cmH20 Plateau Pressure:  [25 cmH20-36 cmH20] 36 cmH20 CXR:  11/18>>>Airspace disease with basilar atelectasis and probable effusions. ETT:  11/17>>>  A:  Respiratory failure secondary to aspiration resulting in cardiac arrest. P:   Full vent support Continue ARDS protocol Suction and lavage as needed. ABG and CXR in AM.  CARDIOVASCULAR  Lab 02/04/12 0350 02/03/12 2023 01/29/2012 1820 01/18/2012 1812  TROPONINI 0.58* 0.63* 0.35* --  LATICACIDVEN -- -- 1.7 1.8  PROBNP -- -- 2816.0* --   ECG:  11/17>>>sinus brady with PVCs Lines: R radial a-line 11/17>>>  L [place] TLC 11/17>>>  A: Septic Shock - now on both Vasopressin and norepinephrine.  CVP noted. Cortisol 23.2.  Not a candidate for hypothermia r/t sepsis.  P:  Continue pressor support CVP monitoring Q4. IVF. 2D echo done, awaiting results See infection section  RENAL  Lab 02/04/12 0350 02/03/12 2024 02/03/12 0231 02/03/12 0151 01/29/2012 1820  NA 136 134* 130* 137 134*  K 4.2 3.8 -- -- --  CL 105 105 100 106 101  CO2 18* 17* 18* -- 20  BUN 34* 31* 27* 28* 26*  CREATININE 3.51* 3.23* 2.64*  2.90* 2.44*  CALCIUM 7.2* 7.0* 7.1* -- 7.4*  MG 2.2 -- 2.0 -- 2.0  PHOS 3.9 -- 2.8 -- 2.1*   Intake/Output      11/18 0701 - 11/19 0700 11/19 0701 - 11/20 0700   I.V. (mL/kg) 1851.6 (15.8)    Other 5    IV Piggyback 966    Total Intake(mL/kg) 2822.6 (24.1)    Urine (mL/kg/hr) 258 (0.1)    Emesis/NG output     Drains 5    Total Output 263    Net +2559.6          Foley:  In place from [facility].  A:  Acute renal failure presumed, baseline unknown. P:   Monitor electrolytes and Cr. Replace as needed. Gentle  hydration.  GASTROINTESTINAL  Lab 02/03/12 2024 2012/02/29 1820  AST 50* 49*  ALT 51 63*  ALKPHOS 163* 202*  BILITOT 0.9 1.6*  PROT 5.4* 4.8*  ALBUMIN 1.6* 1.7*    A:  Distended post a gangrenous galbladder. Post-operative Ileus Protein malnutrition - nutrition recommends initiation of TPN  P:   Surgery's input appreciated. NG to LIWS NPO. Monitor.  HEMATOLOGIC  Lab 02/04/12 0350 02/03/12 2024 02/03/12 0231 02/03/12 0151 Feb 29, 2012 1820  HGB 8.2* 8.3* 8.6* 9.2* 9.1*  HCT 23.9* 23.4* 25.2* 27.0* 26.3*  PLT 231 170 247 -- 258  INR -- -- -- -- 1.17  APTT -- -- -- -- 28   A:  Leukocytosis likely from sepsis. Anemia P:  Monitor CBC Ttransfusion for HGB less than 7   INFECTIOUS  Lab 02/04/12 0350 02/03/12 2024 02/03/12 0231 2012/02/29 1820  WBC 24.8* 23.9* 26.3* 20.6*  PROCALCITON -- -- -- --   Cultures: Blood 11/17>>> Sputum 11/17>>>GRAM NEGATIVE RODS  Antibiotics: Vanc 11/17>>> Zosyn 11/17>>> Diflucan11/17>>>  A:  Septic Shock - Abdominal source and aspiration PNA. P:   Abx as above. Will tailor for cultures. CT of ABD  ENDOCRINE  Lab 02/04/12 0743 02/04/12 0346 02/04/12 0008 02/03/12 1959 02/03/12 1621  GLUCAP 109* 130* 120* 132* 114*   A:  Diabetes history.   P:   ISS CBG  NEUROLOGIC  A:  Acute encephalopathy -  Likely severe anoxic injury however, CT of head 11/18 shows nonspecific white matter changes and no intraparenchymal hemorrhage. EEG shows signs consistent with anoxic injury  P:   Neuro following Continue Keppra Continue Depakote Additional EEG per Neuro to address severity of encephalopathy  BEST PRACTICE / DISPOSITION Level of Care:  ICU Primary Service:  PCCM Consultants:  Surgery and Neuro Code Status:  Full Diet:  NPO DVT Px:  SCDs GI Px:  Protonix. Skin Integrity:  Intact. Social / Family: Updated   Spoke with son again today, patient is stable from a physical standpoint but neurologically the patient remains very  unstable.  There are clear evidence of anoxic injury.  The family does not wish for a trach/peg and once the health care team is ready to present findings the family likely to withdraw life supporting measures.  CC time 35 min.  Alyson Reedy, M.D. [facility] Pulmonary/Critical Care Medicine. Pager: [phone]. After hours pager: [phone].

## 2012-02-03 NOTE — H&P (Signed)
Name: Jeremiah Rivera MRN: [id number] DOB: 1935-03-28    LOS: 1  Referring Provider:  [facility] surgery. Reason for Referral:  Cardiac arrest and respiratory failure  PULMONARY / CRITICAL CARE MEDICINE  HPI:  76 year old presented to [facility] 10 days ago with a gangrenous galbladder that was removed followed by ERCP.  The patient was admitted to the floor where he had a presumed aspiration event and cardiac arrest.  Code x12 minutes with subsequent myoclonus.  At the family's request patient was transferred to [facility].  Current Status:   Vital Signs: Temp:  [97.2 F (36.2 C)-97.7 F (36.5 C)] 97.2 F (36.2 C) (11/18 1145) Pulse Rate:  [52-63] 58  (11/18 1145) Resp:  [16-25] 23  (11/18 1145) BP: (91-120)/(47-90) 120/53 mmHg (11/18 1145) SpO2:  [92 %-100 %] 92 % (11/18 1145) FiO2 (%):  [90 %-100 %] 100 % (11/18 1116) Weight:  [117.3 kg (258 lb 9.6 oz)] 117.3 kg (258 lb 9.6 oz) (11/17 1729)  Physical Examination: General:  Obese male with distended abdomen. Neuro:  Myoclonus, no corneal, minimal gag, no dolls eye. HEENT:  [place]/AT, EOM-I spontaneous with myoclonus. Neck:  Supple, -LAN and -thyromegally. Cardiovascular:  RRR, Nl S1/S2, -M/R/G. Lungs:  Coarse BS diffusely. Abdomen:  Distended, hypertympanic, -BS. Musculoskeletal:  -edema and -tenderness. Skin:  Intact.  Active Problems:  Acute respiratory failure  Metabolic acidosis  Anoxic brain injury  Altered mental status  Cardiac arrest  Myoclonus  Renal failure   ASSESSMENT AND PLAN  PULMONARY  Lab 02/03/12 0235 02-29-12 2120 02/29/12 1812  PHART 7.365 -- 7.361  PCO2ART 31.6* -- 33.4*  PO2ART 66.0* -- 99.0  HCO3 18.0* -- 18.9*  O2SAT 92.0 76.2 98.0   Ventilator Settings: Vent Mode:  [-] PRVC FiO2 (%):  [90 %-100 %] 100 % Set Rate:  [15 bmp-16 bmp] 15 bmp Vt Set:  [580 mL] 580 mL PEEP:  [5 cmH20-14 cmH20] 14 cmH20 Plateau Pressure:  [26 cmH20-30 cmH20] 30 cmH20 CXR:  ET tube ok, TLC OK. ETT:  11/17>>>  A:   Respiratory failure secondary to aspiration resulting in cardiac arrest. P:   Full vent support, increase PEEP 14. Move ET tube down 3 cm. Suction and lavage as needed. ABG and CXR in AM. ARDS protocol.  CARDIOVASCULAR  Lab 2012/02/29 1820 02/29/2012 1812  TROPONINI 0.35* --  LATICACIDVEN 1.7 1.8  PROBNP 2816.0* --   ECG:  Pending. Lines: R radial a-line 11/17>>>  L [place] TLC 11/17>>>  A: Shock due to propofol requiring low dose dopamine, hypertensive off propofol but will attempt to keep on propofol until evaluated by neuro. P:  Propofol as discussed above. CVP. IVF. 2D echo pending. EKG. Not a candidate for hypothermia given sepsis and presumed evolving septic shock from abdominal source as well as aspiration. No active cardiac issues at this time, arrest was respiratory in nature by report.  RENAL  Lab 02/03/12 0231 02/03/12 0151 February 29, 2012 1820  NA 130* 137 134*  K 3.9 4.3 --  CL 100 106 101  CO2 18* -- 20  BUN 27* 28* 26*  CREATININE 2.64* 2.90* 2.44*  CALCIUM 7.1* -- 7.4*  MG 2.0 -- 2.0  PHOS 2.8 -- 2.1*   Intake/Output      11/17 0701 - 11/18 0700 11/18 0701 - 11/19 0700   I.V. (mL/kg) 1955.5 (16.7) 464.4 (4)   IV Piggyback 825 183   Total Intake(mL/kg) 2780.5 (23.7) 647.4 (5.5)   Urine (mL/kg/hr) 150 (0.1) 27 (0)   Emesis/NG output 600  Drains 30    Total Output 780 27   Net +2000.5 +620.4          Intake/Output Summary (Last 24 hours) at 02/03/12 1254 Last data filed at 02/03/12 1100  Gross per 24 hour  Intake 3427.86 ml  Output    807 ml  Net 2620.86 ml   Foley:  In place from [facility].  A:  Acute renal failure presumed, baseline unknown. P:   Monitor electrolytes. Replace as needed. KVO IVF. Follow CVP.  GASTROINTESTINAL  Lab February 19, 2012 1820  AST 49*  ALT 63*  ALKPHOS 202*  BILITOT 1.6*  PROT 4.8*  ALBUMIN 1.7*   A:  Distended post a gangrenous galbladder. P:   Surgery's input appreciated. AXR noted with ileus. Will keep NPO and if  neurologic situation is improved then will consider TPN until cleared by surgery. Monitor.  HEMATOLOGIC  Lab 02/03/12 0231 02/03/12 0151 2012-02-19 1820  HGB 8.6* 9.2* 9.1*  HCT 25.2* 27.0* 26.3*  PLT 247 -- 258  INR -- -- 1.17  APTT -- -- 28   A:  Leukocytosis likely from sepsis. P:  Monitor CBC No need for transfusion at this time.  INFECTIOUS  Lab 02/03/12 0231 02/19/12 1820  WBC 26.3* 20.6*  PROCALCITON -- --   Cultures: Blood 11/17>>> Urine 11/17>>> Sputum 11/17>>> Antibiotics: Vanc 11/17>>> Zosyn 11/17>>> Diflucan 11/17>>>  A:  Abdominal source and aspiration PNA. P:   Abx as above. Will tailor for cultures. No sepsis protocol, patient is fluid overloaded as is.  ENDOCRINE  Lab 02/03/12 1209 02/03/12 0727 02/03/12 0407 02-19-12 2343 Feb 19, 2012 1958  GLUCAP 136* 138* 158* 184* 169*   A:  Diabetes history.   P:   ISS CBG  NEUROLOGIC  A:  Likely severe anoxic injury. P:   Will CT in AM pending neuro evaluation. EEG pending neuro evaluation.  BEST PRACTICE / DISPOSITION Level of Care:  ICU Primary Service:  PCCM Consultants:  Surgery and Neuro Code Status:  Full Diet:  NPO DVT Px:  SCD's until neuro status is evaluated.  Head CT from [facility] on 11/17 is negative. GI Px:  Protonix. Skin Integrity:  Intact. Social / Family:  Family updated extensively and all questions answered.  After discussion, decided to change patient to LCB with no CPR and no cardioversion.  Will discuss again in AM after neuro re-evaluates and assist with prognosis.  CC time 35 min.  Koren Bound, M.D. Pulmonary and Critical Care Medicine [facility] Pager: [phone]  02/03/2012, 12:50 PM

## 2012-02-03 NOTE — Progress Notes (Signed)
Echocardiogram 2D Echocardiogram has been performed.  Jeremiah Rivera 02/03/2012, 3:01 PM

## 2012-02-03 NOTE — Consult Note (Signed)
Reason for Consult:Abnormal movements Referring Physician: Cyril Mourning  CC: Abnormal Movements  History is obtained from:Referring physician, medical record.   HPI: Jeremiah Rivera is a 76 y.o. male with a PEA arrest on 11/17 in the setting of previous gangrenous gallbladder surgery 10 days ago. The code was 12 minutes per admission note. He was subsequently transferred to [facility]. Here he has been seen to have generalized myoclonus and was treated with 1gm of keppra load and has depakote ordered. Given that the movements are continuing, neurology has been consulted.   ROS: Unable to obtain 2/2 ams.   PMH: Unable to obtain 2/2 ams.    Family History: Unable to obtain 2/2 ams.   Social History: Unable to obtain 2/2 ams.   Exam: Current vital signs: BP 114/64  Pulse 59  Temp 97.7 F (36.5 C) (Oral)  Resp 16  Ht 6\' 3"  (1.905 m)  Wt 117.3 kg (258 lb 9.6 oz)  BMI 32.32 kg/m2  SpO2 97% Vital signs in last 24 hours: Temp:  [97.7 F (36.5 C)] 97.7 F (36.5 C) (11/17 1930) Pulse Rate:  [56-59] 59  (11/17 2311) Resp:  [16-21] 16  (11/17 2311) BP: (91-114)/(51-64) 114/64 mmHg (11/17 2311) SpO2:  [97 %-100 %] 97 % (11/17 2311) FiO2 (%):  [90 %-100 %] 90 % (11/17 2311) Weight:  [117.3 kg (258 lb 9.6 oz)] 117.3 kg (258 lb 9.6 oz) (11/17 1729)  General: In bed, intubated CV: bradycardic Mental Status: Patient does not respond to verbal stimuli.  Does not respond to deep sternal rub.  Does not follow commands.  No verbalizations are noted.   Cranial Nerves: II: patient does not respond confrontation bilaterally, pupils right 3 mm, left 4 mm,and reactive bilaterally III,IV,VI: Dolls eye negative V,VII: corneal reflex absent bilaterally  VIII: patient does not respond to verbal stimuli IX,X: gag reflex absent,  XI: unable to test bilaterally due to coma XII: unable to test due to coma  Motor: Extremities flaccid throughout.  No spontaneous movement noted.  No purposeful movements  noted. Does not respond to nailbed pressure.   He has frequent generalized myoclonus.   Sensory: Does not respond to noxious stimuli in any extremity.  Deep Tendon Reflexes:  Absent throughout.  Plantars: absent bilaterally  Cerebellar: Unable to perform due to coma  Gait: Unable to perform due to coma   I have reviewed labs in epic and the results pertinent to this consultation are: Mildly elevated transaminases(? Due to gallbladder?) Elevated creatinine  Impression: 76 yo M s/p cardiac arrest with generalized myoclonus in the setting of recent PEA arrest. In this setting, this is a poor prognostic indicator, and is typically difficult to treat. Propofol can suppress the myoclonus but has not been shown to alter outcome. At this time, I would recommend treatment with keppra and depakote, and getting an EEG the morning. Also, given the assymetric pupils, I would evaluated with a portable head CT.   Recommendations: 1) Keppra, will give additional 1 gm now, and increase maintence to 750mg  BID(elevated creatinine) 2) Agree with addition of depakote 3) EEG in the AM 4) CT head   Ritta Slot, MD [facility] [phone]  If 7pm- 7am, please page neurology on call at [phone].

## 2012-02-03 NOTE — Progress Notes (Signed)
INITIAL ADULT NUTRITION ASSESSMENT Date: 02/03/2012   Time: 12:57 PM Reason for Assessment: Vent   INTERVENTION: 1. Recommend initiation of TPN until EN is appropriate while pt is intubated  2. RD will continue to follow    DOCUMENTATION CODES Per approved criteria  -Obesity Unspecified     ASSESSMENT: Male 76 y.o.  Dx: Cardiac Arrest   Hx: No past medical history on file.  No past surgical history on file.  Per notes: s/p cholecystectomy and ERCP last week?   Related Meds:     . antiseptic oral rinse  15 mL Mouth Rinse QID  . chlorhexidine  15 mL Mouth Rinse BID  . [COMPLETED] chlorhexidine      . [EXPIRED] etomidate      . [COMPLETED] fluconazole (DIFLUCAN) IV  400 mg Intravenous Once   Followed by  . fluconazole (DIFLUCAN) IV  200 mg Intravenous Q24H  . insulin aspart  2-6 Units Subcutaneous Q4H  . [COMPLETED] levetiracetam  1,000 mg Intravenous Once  . [COMPLETED] levetiracetam  1,000 mg Intravenous Once  . levetiracetam  1,000 mg Intravenous Once  . levetiracetam  1,500 mg Intravenous Q12H  . [EXPIRED] lidocaine (cardiac) 100 mg/39ml      . pantoprazole (PROTONIX) IV  40 mg Intravenous Q24H  . piperacillin-tazobactam (ZOSYN)  IV  3.375 g Intravenous Q8H  . [COMPLETED] rocuronium      . [EXPIRED] succinylcholine      . [COMPLETED] valproate sodium  1,750 mg Intravenous Once  . valproate sodium  400 mg Intravenous Q8H  . vancomycin  1,500 mg Intravenous Q24H  . [COMPLETED] vancomycin  2,500 mg Intravenous Once  . [DISCONTINUED] antiseptic oral rinse  15 mL Mouth Rinse q12n4p  . [DISCONTINUED] chlorhexidine  15 mL Mouth Rinse BID  . [DISCONTINUED] levetiracetam  1,000 mg Intravenous Q12H  . [DISCONTINUED] levetiracetam  500 mg Intravenous Q12H  . [DISCONTINUED] levetiracetam  750 mg Intravenous Q12H  . [DISCONTINUED] valproate sodium  10 mg/kg/day Intravenous Q12H  . [DISCONTINUED] vancomycin  2,500 mg Intravenous Once     Ht: 6\' 3"  (190.5 cm)  Wt: 258 lb  9.6 oz (117.3 kg)  Ideal Wt: 89 kg  % Ideal Wt: 131%  Usual Wt:  Wt Readings from Last 5 Encounters:  02/07/2012 258 lb 9.6 oz (117.3 kg)    % Usual Wt: --  Body mass index is 32.32 kg/(m^2). Pt is obese class 1 per current BMI   Food/Nutrition Related Hx: Pt with aspiration event? Indicated some weight loss of unknown amount PTA, no poor appetite.   Labs:  CMP     Component Value Date/Time   NA 130* 02/03/2012 0231   K 3.9 02/03/2012 0231   CL 100 02/03/2012 0231   CO2 18* 02/03/2012 0231   GLUCOSE 200* 02/03/2012 0231   BUN 27* 02/03/2012 0231   CREATININE 2.64* 02/03/2012 0231   CALCIUM 7.1* 02/03/2012 0231   PROT 4.8* 01/24/2012 1820   ALBUMIN 1.7* 02/14/2012 1820   AST 49* 01/19/2012 1820   ALT 63* 02/11/2012 1820   ALKPHOS 202* 02/13/2012 1820   BILITOT 1.6* 02/04/2012 1820   GFRNONAA 22* 02/03/2012 0231   GFRAA 26* 02/03/2012 0231      Intake/Output Summary (Last 24 hours) at 02/03/12 1300 Last data filed at 02/03/12 1248  Gross per 24 hour  Intake 3621.01 ml  Output    857 ml  Net 2764.01 ml     Diet Order: NPO  Supplements/Tube Feeding: none   IVF:  sodium chloride Last Rate: 10 mL/hr at 02/03/12 0920  sodium chloride Last Rate: 10 mL/hr at 02/03/12 1200  sodium chloride Last Rate: 20 mL/hr at 02/04/12 2021  fentaNYL infusion INTRAVENOUS Last Rate: 50 mcg/hr (02/03/12 0700)  midazolam (VERSED) infusion Last Rate: 8 mg/hr (02/03/12 0900)  norepinephrine (LEVOPHED) Adult infusion Last Rate: 15 mcg/min (02/03/12 1245)  vasopressin (PITRESSIN) infusion - *FOR SHOCK* Last Rate: 0.03 Units/min (02/03/12 1124)  [DISCONTINUED] norepinephrine (LEVOPHED) Adult infusion Last Rate: 22 mcg/min (02/04/12 2251)  [DISCONTINUED] propofol Last Rate: 30 mcg/kg/min (Feb 04, 2012 2201)   Patient is currently intubated on ventilator support.  MV: 12.5 Temp:Temp (24hrs), Avg:97.5 F (36.4 C), Min:97.2 F (36.2 C), Max:97.7 F (36.5 C)  Propofol: D/C'd     Estimated Nutritional Needs:   Kcal: 2235   Underfeeding goal: 1341-1565 kcal  Protein: 160-185 gm  Fluid: 2.2 L   Pt brought to [facility] from outside hospital. Possible aspiration event after cholecystectomy and ERCP. Unknown down time, neurology consulted. Pt with distended abdomin, ileus per ABX. Will remain NPO at this time.  Current intubated, septic, seizure activity.   Recommend initiation of TPN while unable to start EN. Pt meets criteria for permissive underfeeding at this time. Likely will not be able to meet protein goals with kcal restriction.   NUTRITION DIAGNOSIS: Inadequate oral intake r/t inability to eat AEB NPO.    MONITORING/EVALUATION(Goals): Goal: Enteral nutrition to provide 60-70% of estimated calorie needs (22-25 kcals/kg ideal body weight) and >/= 90% of estimated protein needs, based on ASPEN guidelines for permissive underfeeding in critically ill obese individuals.  Monitor: vent status, weight, labs, initiation of TPN vs ileus resolving   EDUCATION NEEDS: -No education needs identified at this time   Clarene Duke RD, LDN Pager [phone] After Hours pager [phone]  02/03/2012, 12:57 PM

## 2012-02-03 NOTE — Progress Notes (Signed)
RT Note: Advanced ET tube 3cm per MD order, CXR pending.

## 2012-02-03 NOTE — Progress Notes (Addendum)
The patient is not responding to any painful stimulus.  He has no corneal reflexes, no gag, no Doll's eye, With the exception of his being on sedatives, this patient could be clinically determined to be brain dead if a full examination was done.  EEG has been done.   With these dire neurological clinical finding, there is no role for surgery.  We will sign off. Marta Lamas. Gae Bon, MD, FACS [phone] [facility] Surgery

## 2012-02-03 NOTE — Progress Notes (Signed)
Portable EEG completed; Dr Roseanne Reno made aware of EEG.

## 2012-02-03 NOTE — Progress Notes (Signed)
Patient ID: Jeremiah Rivera, male   DOB: 1935-10-07, 76 y.o.   MRN: [id number]    Subjective: Pt unresponsive.  Been seizing since his PEA arrest.    Objective: Vital signs in last 24 hours: Temp:  [97.5 F (36.4 C)-97.7 F (36.5 C)] 97.5 F (36.4 C) (11/18 0700) Pulse Rate:  [52-59] 55  (11/18 0700) Resp:  [16-25] 20  (11/18 0700) BP: (91-119)/(47-90) 110/59 mmHg (11/18 0700) SpO2:  [94 %-100 %] 97 % (11/18 0700) FiO2 (%):  [90 %-100 %] 100 % (11/18 0400) Weight:  [258 lb 9.6 oz (117.3 kg)] 258 lb 9.6 oz (117.3 kg) (11/17 1729)    Intake/Output from previous day: 11/17 0701 - 11/18 0700 In: 2584.4 [I.V.:1771.9; IV Piggyback:812.5] Out: 780 [Urine:150; Emesis/NG output:600; Drains:30] Intake/Output this shift:    PE: Abd: soft, distended, absent BS, JP drain with minimal serosang output.  Lab Results:   Basename 02/03/12 0231 02/03/12 0151 2012/02/26 1820  WBC 26.3* -- 20.6*  HGB 8.6* 9.2* --  HCT 25.2* 27.0* --  PLT 247 -- 258   BMET  Basename 02/03/12 0231 02/03/12 0151 26-Feb-2012 1820  NA 130* 137 --  K 3.9 4.3 --  CL 100 106 --  CO2 18* -- 20  GLUCOSE 200* 197* --  BUN 27* 28* --  CREATININE 2.64* 2.90* --  CALCIUM 7.1* -- 7.4*   PT/INR  Basename Feb 26, 2012 1820  LABPROT 14.7  INR 1.17   CMP     Component Value Date/Time   NA 130* 02/03/2012 0231   K 3.9 02/03/2012 0231   CL 100 02/03/2012 0231   CO2 18* 02/03/2012 0231   GLUCOSE 200* 02/03/2012 0231   BUN 27* 02/03/2012 0231   CREATININE 2.64* 02/03/2012 0231   CALCIUM 7.1* 02/03/2012 0231   PROT 4.8* 02/26/2012 1820   ALBUMIN 1.7* 02/26/2012 1820   AST 49* 26-Feb-2012 1820   ALT 63* Feb 26, 2012 1820   ALKPHOS 202* 02-26-12 1820   BILITOT 1.6* 02-26-12 1820   GFRNONAA 22* 02/03/2012 0231   GFRAA 26* 02/03/2012 0231   Lipase     Component Value Date/Time   LIPASE 50 Feb 26, 2012 1820       Studies/Results: Dg Chest Port 1 View  02/03/2012  *RADIOLOGY REPORT*  Clinical Data: Evaluate  endotracheal tube position  PORTABLE CHEST - 1 VIEW  Comparison: Portable chest x-ray of 02/26/12  Findings: The tip of the endotracheal tube is now approximately 6.4 cm above the carina.  A left-sided central venous line tip overlies the expected SVC - RA junction.  There is slightly more opacity at the right lung base most consistent with atelectasis and possible bilateral effusions.  Cardiomegaly is stable. There may be mild pulmonary vascular congestion.  An NG tube remains.  IMPRESSION:  1.  Tip of endotracheal tube 6.4 cm above the carina. 2.  Increase in basilar opacities consistent with atelectasis and effusions. Possible mild pulmonary vascular congestion.   Original Report Authenticated By: Dwyane Dee, M.D.    Dg Chest Port 1 View  2012/02/26  *RADIOLOGY REPORT*  Clinical Data: Central line placement  PORTABLE CHEST - 1 VIEW  Comparison: Portable exam 1839 hours without priors for comparison  Findings: Tip of endotracheal tube 2.1 cm above carina. Nasogastric tube coiled in proximal stomach. Left subclavian central venous catheter tip projecting over cavoatrial junction. Enlargement of cardiac silhouette with pulmonary vascular congestion. Bilateral pulmonary infiltrates. No gross pleural effusion or pneumothorax.  IMPRESSION: Line and tube positions as above. Bilateral pulmonary infiltrates.  Original Report Authenticated By: Ulyses Southward, M.D.    Dg Abd Portable 1v  02/06/2012  *RADIOLOGY REPORT*  Clinical Data: Abdominal distention  PORTABLE ABDOMEN - 1 VIEW  Comparison: Portable exam 1843 hours repeated at 1846 hours without priors for comparison  Findings: Left femoral line tip projects over L5. Air filled loops of large and small bowel are seen throughout the abdomen with small bowel dilatation identified. Nasogastric tube in stomach. No definite bowel wall thickening. Surgical drain right upper quadrant with skin clips and surgical clips question prior cholecystectomy. Bones unremarkable.  Poorly defined opacities projecting over the right mid abdomen could represent renal calculi or artifacts.  IMPRESSION: Question postoperative ileus. Cannot exclude left renal calculi.   Original Report Authenticated By: Ulyses Southward, M.D.    Ct Portable Head W/o Cm  02/03/2012  *RADIOLOGY REPORT*  Clinical Data: Asymmetric pupils, decreased mental status.  CT HEAD WITHOUT CONTRAST  Technique:  Contiguous axial images were obtained from the base of the skull through the vertex without contrast.  Comparison: None.  Findings: Nonspecific periventricular subcortical white matter hypodensities.  Cannot exclude a subtle loss of gray-white distinction within the right parietal lobe as seen on series one image 25.  No hydrocephalous.  No intraparenchymal hemorrhage, mass, mass effect, or abnormal extra-axial fluid collection. Intubated patient.  Paranasal sinuses are predominately clear. Left mastoid air cell effusion.  No acute osseous finding. Rotation at C1-2 may be positioning.  IMPRESSION: Nonspecific white matter changes. Cannot exclude a subtle loss of gray-white distinction within the right parietal lobe. No intraparenchymal hemorrhage.  Recommend MRI if concern for acute ischemia persists.   Original Report Authenticated By: Jearld Lesch, M.D.     Anti-infectives: Anti-infectives     Start     Dose/Rate Route Frequency Ordered Stop   02/03/12 2000   fluconazole (DIFLUCAN) IVPB 200 mg        200 mg 100 mL/hr over 60 Minutes Intravenous Every 24 hours Feb 06, 2012 1919     02/03/12 2000   vancomycin (VANCOCIN) 1,500 mg in sodium chloride 0.9 % 250 mL IVPB        1,500 mg 125 mL/hr over 120 Minutes Intravenous Every 24 hours 2012/02/06 1919     02/06/12 2330   vancomycin (VANCOCIN) 2,500 mg in sodium chloride 0.9 % 250 mL IVPB        2,500 mg 125 mL/hr over 120 Minutes Intravenous  Once 2012-02-06 2233 02/03/12 0122   2012/02/06 2200  piperacillin-tazobactam (ZOSYN) IVPB 3.375 g       3.375 g 12.5  mL/hr over 240 Minutes Intravenous 3 times per day 06-Feb-2012 1919     February 06, 2012 2000   fluconazole (DIFLUCAN) IVPB 400 mg        400 mg 200 mL/hr over 60 Minutes Intravenous  Once Feb 06, 2012 1919 Feb 06, 2012 2132   02/06/12 1930   vancomycin (VANCOCIN) 2,500 mg in sodium chloride 0.9 % 250 mL IVPB        2,500 mg 125 mL/hr over 120 Minutes Intravenous  Once Feb 06, 2012 1919             Assessment/Plan  1. S/p lap chole with ERCP 2. PEA arrest 3. Myoclonus 4. Ileus, multifactorial  Plan: 1.  The patient's abd is still distended likely secondary to ileus.  His drain is serosang which would go against any type of post op complications such as abscess or bile leak. 2. Further care per CCM, will follow    LOS: 1 day  Jolinda Pinkstaff E 02/03/2012, 8:50 AM Pager: [phone]

## 2012-02-03 NOTE — Care Management Note (Signed)
    Page 1 of 1   02/03/2012     11:44:59 AM   CARE MANAGEMENT NOTE 02/03/2012  Patient:  Jeremiah Rivera, Jeremiah Rivera   Account Number:  [id number]  Date Initiated:  02/03/2012  Documentation initiated by:  Junius Creamer  Subjective/Objective Assessment:   adm w resp failure     Action/Plan:   lives alone, supp fam,   Anticipated DC Date:     Anticipated DC Plan:        DC Planning Services  CM consult      Choice offered to / List presented to:             Status of service:   Medicare Important Message given?   (If response is "NO", the following Medicare IM given date fields will be blank) Date Medicare IM given:   Date Additional Medicare IM given:    Discharge Disposition:    Per UR Regulation:  Reviewed for med. necessity/level of care/duration of stay  If discussed at Long Length of Stay Meetings, dates discussed:    Comments:  11/18 11:44a debbie Encarnacion Bole rn,bsn [phone]

## 2012-02-04 ENCOUNTER — Inpatient Hospital Stay (HOSPITAL_COMMUNITY): Payer: Medicare Other

## 2012-02-04 DIAGNOSIS — G931 Anoxic brain damage, not elsewhere classified: Secondary | ICD-10-CM | POA: Diagnosis present

## 2012-02-04 LAB — BLOOD GAS, ARTERIAL
Acid-base deficit: 7.7 mmol/L — ABNORMAL HIGH (ref 0.0–2.0)
Bicarbonate: 17.5 mEq/L — ABNORMAL LOW (ref 20.0–24.0)
Drawn by: 331761
FIO2: 1 %
O2 Saturation: 97.3 %
RATE: 30 resp/min
TCO2: 18.6 mmol/L (ref 0–100)
pCO2 arterial: 36.3 mmHg (ref 35.0–45.0)
pO2, Arterial: 112 mmHg — ABNORMAL HIGH (ref 80.0–100.0)

## 2012-02-04 LAB — PHOSPHORUS: Phosphorus: 3.9 mg/dL (ref 2.3–4.6)

## 2012-02-04 LAB — CBC
HCT: 23.9 % — ABNORMAL LOW (ref 39.0–52.0)
Hemoglobin: 8.2 g/dL — ABNORMAL LOW (ref 13.0–17.0)
RDW: 15.9 % — ABNORMAL HIGH (ref 11.5–15.5)
WBC: 24.8 10*3/uL — ABNORMAL HIGH (ref 4.0–10.5)

## 2012-02-04 LAB — BASIC METABOLIC PANEL
BUN: 34 mg/dL — ABNORMAL HIGH (ref 6–23)
Chloride: 105 mEq/L (ref 96–112)
GFR calc Af Amer: 18 mL/min — ABNORMAL LOW (ref >90)
GFR calc non Af Amer: 16 mL/min — ABNORMAL LOW (ref >90)
Potassium: 4.2 mEq/L (ref 3.5–5.1)
Sodium: 136 mEq/L (ref 135–145)

## 2012-02-04 LAB — GLUCOSE, CAPILLARY
Glucose-Capillary: 108 mg/dL — ABNORMAL HIGH (ref 70–99)
Glucose-Capillary: 120 mg/dL — ABNORMAL HIGH (ref 70–99)
Glucose-Capillary: 130 mg/dL — ABNORMAL HIGH (ref 70–99)

## 2012-02-04 LAB — TROPONIN I
Troponin I: 0.58 ng/mL (ref <0.30)
Troponin I: 0.54 ng/mL (ref <0.30)

## 2012-02-04 MED ORDER — DEXTROSE 5 % IV SOLN
500.0000 mg | Freq: Once | INTRAVENOUS | Status: AC
  Administered 2012-02-04: 500 mg via INTRAVENOUS
  Filled 2012-02-04: qty 5

## 2012-02-04 MED ORDER — VALPROATE SODIUM 500 MG/5ML IV SOLN
750.0000 mg | Freq: Two times a day (BID) | INTRAVENOUS | Status: DC
  Administered 2012-02-04 – 2012-02-08 (×8): 750 mg via INTRAVENOUS
  Filled 2012-02-04 (×11): qty 7.5

## 2012-02-04 MED ORDER — VANCOMYCIN HCL 1000 MG IV SOLR
1500.0000 mg | INTRAVENOUS | Status: DC
  Administered 2012-02-05: 1500 mg via INTRAVENOUS
  Filled 2012-02-04 (×2): qty 1500

## 2012-02-04 NOTE — Procedures (Signed)
EEG NUMBER:  [id number].  REFERRING PHYSICIAN:  Oretha Milch, MD  INDICATION FOR STUDY:  A 76 year old man who is status post anoxic brain injury and currently unresponsive, intubated, and on mechanical ventilation.  Generalized myoclonic activity has been noted.  DESCRIPTION:  This is an EEG recording performed at the patient's bedside in Intensive Care Unit.  The patient was unresponsive including to tactile stimulation as well as mechanical eye opening.  Predominant background activity consisted of a burst-suppression pattern with periods of inactivity for 1-2 seconds alternating with bursts of sharp waves and spike activity diffusely and symmetrically.  Occasional generalized spike and slow-wave discharges occurred as well.  Photic stimulation produced no appreciable occipital driving response.  INTERPRETATION:  This EEG showed findings consistent with severe hypoxic encephalopathy with a burst-suppression pattern of activity with burst that consisting of generalized epileptiform discharges, consistent with status epilepticus.     Noel Christmas, MD    [id number] D:  02/03/2012 12:00:19  T:  02/04/2012 05:57:38  Job #:  [id number]

## 2012-02-04 NOTE — Progress Notes (Signed)
Continuous EEG initiated. Dr Roseanne Reno informed.

## 2012-02-04 NOTE — Progress Notes (Signed)
Subjective: Myoclonic activity apparently has been controlled. No frank seizure activity reported. Patient is currently on fentanyl and Versed as well as pressure agents to maintain adequate blood pressure. Propofol was discontinued.  Objective: Current vital signs: BP 117/57  Pulse 57  Temp 97.6 F (36.4 C) (Axillary)  Resp 30  Ht 6\' 3"  (1.905 m)  Wt 117.3 kg (258 lb 9.6 oz)  BMI 32.32 kg/m2  SpO2 100%  Neurologic Exam: Unresponsive and intubated on mechanical ventilation. No spontaneous respirations noted. No response to noxious stimuli. Pupils were equal at 3 mm and did not react to light. Extraocular movements are absent with oculocephalic maneuvers. Corneal reflexes were intact bilaterally. Face was symmetrical. Muscle tone was flaccid throughout. There were no spontaneous movements. No abnormal posturing noted. No withdrawal movements to noxious stimuli extremities. Deep tendon reflexes 2+ and symmetrical in lower extremities. Plantar responses were mute bilaterally.  Lab Results: Results for orders placed during the hospital encounter of 01/18/2012 (from the past 48 hour(s))  MRSA PCR SCREENING     Status: Normal   Collection Time   02/14/2012  5:56 PM      Component Value Range Comment   MRSA by PCR NEGATIVE  NEGATIVE   LACTIC ACID, PLASMA     Status: Normal   Collection Time   02/13/2012  6:12 PM      Component Value Range Comment   Lactic Acid, Venous 1.8  0.5 - 2.2 mmol/L   CORTISOL     Status: Normal   Collection Time   02/03/2012  6:12 PM      Component Value Range Comment   Cortisol, Plasma 23.2     POCT I-STAT 3, BLOOD GAS (G3+)     Status: Abnormal   Collection Time   02/07/2012  6:12 PM      Component Value Range Comment   pH, Arterial 7.361  7.350 - 7.450    pCO2 arterial 33.4 (*) 35.0 - 45.0 mmHg    pO2, Arterial 99.0  80.0 - 100.0 mmHg    Bicarbonate 18.9 (*) 20.0 - 24.0 mEq/L    TCO2 20  0 - 100 mmol/L    O2 Saturation 98.0      Acid-base deficit 6.0 (*)  0.0 - 2.0 mmol/L    Patient temperature 98.2 F      Collection site RADIAL, ALLEN'S TEST ACCEPTABLE      Drawn by Operator      Sample type ARTERIAL     APTT     Status: Normal   Collection Time   01/24/2012  6:20 PM      Component Value Range Comment   aPTT 28  24 - 37 seconds   PROTIME-INR     Status: Normal   Collection Time   01/18/2012  6:20 PM      Component Value Range Comment   Prothrombin Time 14.7  11.6 - 15.2 seconds    INR 1.17  0.00 - 1.49   MAGNESIUM     Status: Normal   Collection Time   02/11/2012  6:20 PM      Component Value Range Comment   Magnesium 2.0  1.5 - 2.5 mg/dL   PHOSPHORUS     Status: Abnormal   Collection Time   01/31/2012  6:20 PM      Component Value Range Comment   Phosphorus 2.1 (*) 2.3 - 4.6 mg/dL   CBC WITH DIFFERENTIAL     Status: Abnormal   Collection Time   02/15/2012  6:20 PM      Component Value Range Comment   WBC 20.6 (*) 4.0 - 10.5 K/uL    RBC 3.24 (*) 4.22 - 5.81 MIL/uL    Hemoglobin 9.1 (*) 13.0 - 17.0 g/dL    HCT 57.8 (*) 46.9 - 52.0 %    MCV 81.2  78.0 - 100.0 fL    MCH 28.1  26.0 - 34.0 pg    MCHC 34.6  30.0 - 36.0 g/dL    RDW 62.9  52.8 - 41.3 %    Platelets 258  150 - 400 K/uL    Neutrophils Relative 92 (*) 43 - 77 %    Lymphocytes Relative 5 (*) 12 - 46 %    Monocytes Relative 2 (*) 3 - 12 %    Eosinophils Relative 1  0 - 5 %    Basophils Relative 0  0 - 1 %    Neutro Abs 19.0 (*) 1.7 - 7.7 K/uL    Lymphs Abs 1.0  0.7 - 4.0 K/uL    Monocytes Absolute 0.4  0.1 - 1.0 K/uL    Eosinophils Absolute 0.2  0.0 - 0.7 K/uL    Basophils Absolute 0.0  0.0 - 0.1 K/uL    RBC Morphology TEARDROP CELLS      WBC Morphology INCREASED BANDS (>20% BANDS)     COMPREHENSIVE METABOLIC PANEL     Status: Abnormal   Collection Time   01/30/2012  6:20 PM      Component Value Range Comment   Sodium 134 (*) 135 - 145 mEq/L    Potassium 3.6  3.5 - 5.1 mEq/L    Chloride 101  96 - 112 mEq/L    CO2 20  19 - 32 mEq/L    Glucose, Bld 216 (*) 70 - 99  mg/dL    BUN 26 (*) 6 - 23 mg/dL    Creatinine, Ser 2.44 (*) 0.50 - 1.35 mg/dL    Calcium 7.4 (*) 8.4 - 10.5 mg/dL    Total Protein 4.8 (*) 6.0 - 8.3 g/dL    Albumin 1.7 (*) 3.5 - 5.2 g/dL    AST 49 (*) 0 - 37 U/L    ALT 63 (*) 0 - 53 U/L    Alkaline Phosphatase 202 (*) 39 - 117 U/L    Total Bilirubin 1.6 (*) 0.3 - 1.2 mg/dL    GFR calc non Af Amer 25 (*) >90 mL/min    GFR calc Af Amer 28 (*) >90 mL/min   LACTIC ACID, PLASMA     Status: Normal   Collection Time   02/07/2012  6:20 PM      Component Value Range Comment   Lactic Acid, Venous 1.7  0.5 - 2.2 mmol/L   AMYLASE     Status: Normal   Collection Time   02/07/2012  6:20 PM      Component Value Range Comment   Amylase 82  0 - 105 U/L   LIPASE, BLOOD     Status: Normal   Collection Time   01/30/2012  6:20 PM      Component Value Range Comment   Lipase 50  11 - 59 U/L   CK TOTAL AND CKMB     Status: Normal   Collection Time   02/04/2012  6:20 PM      Component Value Range Comment   Total CK 231  7 - 232 U/L    CK, MB 3.3  0.3 - 4.0 ng/mL    Relative  Index 1.4  0.0 - 2.5   TROPONIN I     Status: Abnormal   Collection Time   2012-02-18  6:20 PM      Component Value Range Comment   Troponin I 0.35 (*) <0.30 ng/mL   PRO B NATRIURETIC PEPTIDE     Status: Abnormal   Collection Time   18-Feb-2012  6:20 PM      Component Value Range Comment   Pro B Natriuretic peptide (BNP) 2816.0 (*) 0 - 125 pg/mL   CULTURE, BLOOD (ROUTINE X 2)     Status: Normal (Preliminary result)   Collection Time   02/18/12  6:32 PM      Component Value Range Comment   Specimen Description BLOOD HAND RIGHT      Special Requests BOTTLES DRAWN AEROBIC ONLY 10CC      Culture  Setup Time 02/03/2012 08:54      Culture        Value:        BLOOD CULTURE RECEIVED NO GROWTH TO DATE CULTURE WILL BE HELD FOR 5 DAYS BEFORE ISSUING A FINAL NEGATIVE REPORT   Report Status PENDING     CULTURE, BLOOD (ROUTINE X 2)     Status: Normal (Preliminary result)   Collection Time     2012/02/18  6:45 PM      Component Value Range Comment   Specimen Description BLOOD CENTRAL LINE      Special Requests BOTTLES DRAWN AEROBIC AND ANAEROBIC 10CC      Culture  Setup Time 02/03/2012 08:54      Culture        Value:        BLOOD CULTURE RECEIVED NO GROWTH TO DATE CULTURE WILL BE HELD FOR 5 DAYS BEFORE ISSUING A FINAL NEGATIVE REPORT   Report Status PENDING     GLUCOSE, CAPILLARY     Status: Abnormal   Collection Time   Feb 18, 2012  7:58 PM      Component Value Range Comment   Glucose-Capillary 169 (*) 70 - 99 mg/dL   CULTURE, RESPIRATORY     Status: Normal (Preliminary result)   Collection Time   02/18/12  9:01 PM      Component Value Range Comment   Specimen Description INDUCED SPUTUM      Special Requests NONE      Gram Stain        Value: ABUNDANT WBC PRESENT, PREDOMINANTLY PMN     RARE SQUAMOUS EPITHELIAL CELLS PRESENT     ABUNDANT GRAM NEGATIVE RODS   Culture PENDING      Report Status PENDING     CARBOXYHEMOGLOBIN     Status: Abnormal   Collection Time   02/18/12  9:20 PM      Component Value Range Comment   Total hemoglobin 8.7 (*) 13.5 - 18.0 g/dL    O2 Saturation 16.1      Carboxyhemoglobin 0.7  0.5 - 1.5 %    Methemoglobin 1.3  0.0 - 1.5 %   GLUCOSE, CAPILLARY     Status: Abnormal   Collection Time   18-Feb-2012 11:43 PM      Component Value Range Comment   Glucose-Capillary 184 (*) 70 - 99 mg/dL   POCT I-STAT, CHEM 8     Status: Abnormal   Collection Time   02/03/12  1:51 AM      Component Value Range Comment   Sodium 137  135 - 145 mEq/L    Potassium 4.3  3.5 - 5.1  mEq/L    Chloride 106  96 - 112 mEq/L    BUN 28 (*) 6 - 23 mg/dL    Creatinine, Ser 1.61 (*) 0.50 - 1.35 mg/dL    Glucose, Bld 096 (*) 70 - 99 mg/dL    Calcium, Ion 0.45 (*) 1.13 - 1.30 mmol/L    TCO2 17  0 - 100 mmol/L    Hemoglobin 9.2 (*) 13.0 - 17.0 g/dL    HCT 40.9 (*) 81.1 - 52.0 %   CBC     Status: Abnormal   Collection Time   02/03/12  2:31 AM      Component Value Range  Comment   WBC 26.3 (*) 4.0 - 10.5 K/uL    RBC 3.13 (*) 4.22 - 5.81 MIL/uL    Hemoglobin 8.6 (*) 13.0 - 17.0 g/dL    HCT 91.4 (*) 78.2 - 52.0 %    MCV 80.5  78.0 - 100.0 fL    MCH 27.5  26.0 - 34.0 pg    MCHC 34.1  30.0 - 36.0 g/dL    RDW 95.6  21.3 - 08.6 %    Platelets 247  150 - 400 K/uL   BASIC METABOLIC PANEL     Status: Abnormal   Collection Time   02/03/12  2:31 AM      Component Value Range Comment   Sodium 130 (*) 135 - 145 mEq/L DELTA CHECK NOTED   Potassium 3.9  3.5 - 5.1 mEq/L    Chloride 100  96 - 112 mEq/L    CO2 18 (*) 19 - 32 mEq/L    Glucose, Bld 200 (*) 70 - 99 mg/dL    BUN 27 (*) 6 - 23 mg/dL    Creatinine, Ser 5.78 (*) 0.50 - 1.35 mg/dL    Calcium 7.1 (*) 8.4 - 10.5 mg/dL    GFR calc non Af Amer 22 (*) >90 mL/min    GFR calc Af Amer 26 (*) >90 mL/min   MAGNESIUM     Status: Normal   Collection Time   02/03/12  2:31 AM      Component Value Range Comment   Magnesium 2.0  1.5 - 2.5 mg/dL   PHOSPHORUS     Status: Normal   Collection Time   02/03/12  2:31 AM      Component Value Range Comment   Phosphorus 2.8  2.3 - 4.6 mg/dL   POCT I-STAT 3, BLOOD GAS (G3+)     Status: Abnormal   Collection Time   02/03/12  2:35 AM      Component Value Range Comment   pH, Arterial 7.365  7.350 - 7.450    pCO2 arterial 31.6 (*) 35.0 - 45.0 mmHg    pO2, Arterial 66.0 (*) 80.0 - 100.0 mmHg    Bicarbonate 18.0 (*) 20.0 - 24.0 mEq/L    TCO2 19  0 - 100 mmol/L    O2 Saturation 92.0      Acid-base deficit 7.0 (*) 0.0 - 2.0 mmol/L    Sample type ARTERIAL     GLUCOSE, CAPILLARY     Status: Abnormal   Collection Time   02/03/12  4:07 AM      Component Value Range Comment   Glucose-Capillary 158 (*) 70 - 99 mg/dL   GLUCOSE, CAPILLARY     Status: Abnormal   Collection Time   02/03/12  7:27 AM      Component Value Range Comment   Glucose-Capillary 138 (*) 70 - 99 mg/dL  GLUCOSE, CAPILLARY     Status: Abnormal   Collection Time   02/03/12 12:09 PM      Component Value  Range Comment   Glucose-Capillary 136 (*) 70 - 99 mg/dL   GLUCOSE, CAPILLARY     Status: Abnormal   Collection Time   02/03/12  4:21 PM      Component Value Range Comment   Glucose-Capillary 114 (*) 70 - 99 mg/dL   GLUCOSE, CAPILLARY     Status: Abnormal   Collection Time   02/03/12  7:59 PM      Component Value Range Comment   Glucose-Capillary 132 (*) 70 - 99 mg/dL   POCT I-STAT 3, BLOOD GAS (G3+)     Status: Abnormal   Collection Time   02/03/12  8:21 PM      Component Value Range Comment   pH, Arterial 7.319 (*) 7.350 - 7.450    pCO2 arterial 34.6 (*) 35.0 - 45.0 mmHg    pO2, Arterial 49.0 (*) 80.0 - 100.0 mmHg    Bicarbonate 17.7 (*) 20.0 - 24.0 mEq/L    TCO2 19  0 - 100 mmol/L    O2 Saturation 82.0      Acid-base deficit 8.0 (*) 0.0 - 2.0 mmol/L    Patient temperature 98.6 F      Collection site RADIAL, ALLEN'S TEST ACCEPTABLE      Drawn by Nurse      Sample type ARTERIAL     TROPONIN I     Status: Abnormal   Collection Time   02/03/12  8:23 PM      Component Value Range Comment   Troponin I 0.63 (*) <0.30 ng/mL   CBC     Status: Abnormal   Collection Time   02/03/12  8:24 PM      Component Value Range Comment   WBC 23.9 (*) 4.0 - 10.5 K/uL    RBC 2.94 (*) 4.22 - 5.81 MIL/uL    Hemoglobin 8.3 (*) 13.0 - 17.0 g/dL    HCT 40.9 (*) 81.1 - 52.0 %    MCV 79.6  78.0 - 100.0 fL    MCH 28.2  26.0 - 34.0 pg    MCHC 35.5  30.0 - 36.0 g/dL    RDW 91.4 (*) 78.2 - 15.5 %    Platelets 170  150 - 400 K/uL DELTA CHECK NOTED  COMPREHENSIVE METABOLIC PANEL     Status: Abnormal   Collection Time   02/03/12  8:24 PM      Component Value Range Comment   Sodium 134 (*) 135 - 145 mEq/L    Potassium 3.8  3.5 - 5.1 mEq/L    Chloride 105  96 - 112 mEq/L    CO2 17 (*) 19 - 32 mEq/L    Glucose, Bld 141 (*) 70 - 99 mg/dL    BUN 31 (*) 6 - 23 mg/dL    Creatinine, Ser 9.56 (*) 0.50 - 1.35 mg/dL    Calcium 7.0 (*) 8.4 - 10.5 mg/dL    Total Protein 5.4 (*) 6.0 - 8.3 g/dL    Albumin 1.6  (*) 3.5 - 5.2 g/dL    AST 50 (*) 0 - 37 U/L    ALT 51  0 - 53 U/L    Alkaline Phosphatase 163 (*) 39 - 117 U/L    Total Bilirubin 0.9  0.3 - 1.2 mg/dL    GFR calc non Af Amer 17 (*) >90 mL/min    GFR calc Af  Amer 20 (*) >90 mL/min   GLUCOSE, CAPILLARY     Status: Abnormal   Collection Time   02/04/12 12:08 AM      Component Value Range Comment   Glucose-Capillary 120 (*) 70 - 99 mg/dL   GLUCOSE, CAPILLARY     Status: Abnormal   Collection Time   02/04/12  3:46 AM      Component Value Range Comment   Glucose-Capillary 130 (*) 70 - 99 mg/dL   BLOOD GAS, ARTERIAL     Status: Abnormal   Collection Time   02/04/12  3:49 AM      Component Value Range Comment   FIO2 1.00      Delivery systems VENTILATOR      Mode PRESSURE REGULATED VOLUME CONTROL      VT 450      Rate 30.0      Peep/cpap 20.0      pH, Arterial 7.303 (*) 7.350 - 7.450    pCO2 arterial 36.3  35.0 - 45.0 mmHg    pO2, Arterial 112.0 (*) 80.0 - 100.0 mmHg    Bicarbonate 17.5 (*) 20.0 - 24.0 mEq/L    TCO2 18.6  0 - 100 mmol/L    Acid-base deficit 7.7 (*) 0.0 - 2.0 mmol/L    O2 Saturation 97.3      Patient temperature 98.6      Collection site A-LINE      Drawn by [id number]      Sample type ARTERIAL DRAW     CBC     Status: Abnormal   Collection Time   02/04/12  3:50 AM      Component Value Range Comment   WBC 24.8 (*) 4.0 - 10.5 K/uL    RBC 2.98 (*) 4.22 - 5.81 MIL/uL    Hemoglobin 8.2 (*) 13.0 - 17.0 g/dL    HCT 04.5 (*) 40.9 - 52.0 %    MCV 80.2  78.0 - 100.0 fL    MCH 27.5  26.0 - 34.0 pg    MCHC 34.3  30.0 - 36.0 g/dL    RDW 81.1 (*) 91.4 - 15.5 %    Platelets 231  150 - 400 K/uL   BASIC METABOLIC PANEL     Status: Abnormal   Collection Time   02/04/12  3:50 AM      Component Value Range Comment   Sodium 136  135 - 145 mEq/L    Potassium 4.2  3.5 - 5.1 mEq/L    Chloride 105  96 - 112 mEq/L    CO2 18 (*) 19 - 32 mEq/L    Glucose, Bld 135 (*) 70 - 99 mg/dL    BUN 34 (*) 6 - 23 mg/dL    Creatinine, Ser  7.82 (*) 0.50 - 1.35 mg/dL    Calcium 7.2 (*) 8.4 - 10.5 mg/dL    GFR calc non Af Amer 16 (*) >90 mL/min    GFR calc Af Amer 18 (*) >90 mL/min   MAGNESIUM     Status: Normal   Collection Time   02/04/12  3:50 AM      Component Value Range Comment   Magnesium 2.2  1.5 - 2.5 mg/dL   PHOSPHORUS     Status: Normal   Collection Time   02/04/12  3:50 AM      Component Value Range Comment   Phosphorus 3.9  2.3 - 4.6 mg/dL   TROPONIN I     Status: Abnormal   Collection Time  02/04/12  3:50 AM      Component Value Range Comment   Troponin I 0.58 (*) <0.30 ng/mL   GLUCOSE, CAPILLARY     Status: Abnormal   Collection Time   02/04/12  7:43 AM      Component Value Range Comment   Glucose-Capillary 109 (*) 70 - 99 mg/dL     Studies/Results: EEG showed pattern of burst suppression indicative of severe proximal encephalopathy. Burst activity consisted of epileptiform discharges, indicative of probable status epilepticus.  Dg Chest Port 1 View  02/04/2012  *RADIOLOGY REPORT*  Clinical Data: Evaluate endotracheal tube position, shortness of breath  PORTABLE CHEST - 1 VIEW  Comparison: Portable chest x-ray of 02/03/2012  Findings: The tip of the endotracheal tube remains approximately 2.3 cm above the carina.  There is little change in suboptimal aeration and basilar opacities most consistent with atelectasis and effusions, right greater than left.  A left central venous line and an NG tube remain.  IMPRESSION:  1.  No change in position of endotracheal tube 2.3 cm above the carina. 2.  Little change in airspace disease with basilar atelectasis and probable effusions.   Original Report Authenticated By: Dwyane Dee, M.D.    Dg Chest Port 1 View  02/03/2012  *RADIOLOGY REPORT*  Clinical Data: Respiratory distress  PORTABLE CHEST - 1 VIEW  Comparison: Portable exam 2035 hours compared to 0530 hours  Findings: Tip of endotracheal tube 2.1 cm above carina. Nasogastric tube extends into abdomen. Left  subclavian central venous catheter, tip projecting over SVC. Enlargement of cardiac silhouette. Bibasilar atelectasis and probable right pleural effusion. Question perihilar infiltrate right lung. No pneumothorax.  IMPRESSION: No interval change. Bibasilar atelectasis and probable right pleural effusion.   Original Report Authenticated By: Ulyses Southward, M.D.    Dg Chest Port 1 View  02/03/2012  *RADIOLOGY REPORT*  Clinical Data: 76 year old male endotracheal tube readjusted.  PORTABLE CHEST - 1 VIEW  Comparison: 02/03/2012.  Findings: Semi upright portable AP view at 1304 hours.  The patient is less rotated to the right.  Endotracheal tube tip in good position between the level of clavicles and carina.  Resuscitation pad on the left.  Lower lung volumes.  Stable left subclavian central line.  No pneumothorax.  Probable right pleural effusion. Stable cardiac size and mediastinal contours.  Decreased bibasilar ventilation.  Enteric tube courses to the abdomen, tip not included.  IMPRESSION: 1.  Endotracheal tube tip in good position. 2. Otherwise, stable lines and tubes. 3.  Lower lung volumes.  Right pleural effusion may be increased.   Original Report Authenticated By: Erskine Speed, M.D.    Dg Chest Port 1 View  02/03/2012  *RADIOLOGY REPORT*  Clinical Data: Evaluate endotracheal tube position  PORTABLE CHEST - 1 VIEW  Comparison: Portable chest x-ray of 01/24/2012  Findings: The tip of the endotracheal tube is now approximately 6.4 cm above the carina.  A left-sided central venous line tip overlies the expected SVC - RA junction.  There is slightly more opacity at the right lung base most consistent with atelectasis and possible bilateral effusions.  Cardiomegaly is stable. There may be mild pulmonary vascular congestion.  An NG tube remains.  IMPRESSION:  1.  Tip of endotracheal tube 6.4 cm above the carina. 2.  Increase in basilar opacities consistent with atelectasis and effusions. Possible mild pulmonary  vascular congestion.   Original Report Authenticated By: Dwyane Dee, M.D.    Dg Chest Port 1 View  01/26/2012  *RADIOLOGY REPORT*  Clinical Data: Central line placement  PORTABLE CHEST - 1 VIEW  Comparison: Portable exam 1839 hours without priors for comparison  Findings: Tip of endotracheal tube 2.1 cm above carina. Nasogastric tube coiled in proximal stomach. Left subclavian central venous catheter tip projecting over cavoatrial junction. Enlargement of cardiac silhouette with pulmonary vascular congestion. Bilateral pulmonary infiltrates. No gross pleural effusion or pneumothorax.  IMPRESSION: Line and tube positions as above. Bilateral pulmonary infiltrates.   Original Report Authenticated By: Ulyses Southward, M.D.    Dg Abd Portable 1v  02/10/2012  *RADIOLOGY REPORT*  Clinical Data: Abdominal distention  PORTABLE ABDOMEN - 1 VIEW  Comparison: Portable exam 1843 hours repeated at 1846 hours without priors for comparison  Findings: Left femoral line tip projects over L5. Air filled loops of large and small bowel are seen throughout the abdomen with small bowel dilatation identified. Nasogastric tube in stomach. No definite bowel wall thickening. Surgical drain right upper quadrant with skin clips and surgical clips question prior cholecystectomy. Bones unremarkable. Poorly defined opacities projecting over the right mid abdomen could represent renal calculi or artifacts.  IMPRESSION: Question postoperative ileus. Cannot exclude left renal calculi.   Original Report Authenticated By: Ulyses Southward, M.D.    Ct Portable Head W/o Cm  02/03/2012  *RADIOLOGY REPORT*  Clinical Data: Asymmetric pupils, decreased mental status.  CT HEAD WITHOUT CONTRAST  Technique:  Contiguous axial images were obtained from the base of the skull through the vertex without contrast.  Comparison: None.  Findings: Nonspecific periventricular subcortical white matter hypodensities.  Cannot exclude a subtle loss of gray-white distinction  within the right parietal lobe as seen on series one image 25.  No hydrocephalous.  No intraparenchymal hemorrhage, mass, mass effect, or abnormal extra-axial fluid collection. Intubated patient.  Paranasal sinuses are predominately clear. Left mastoid air cell effusion.  No acute osseous finding. Rotation at C1-2 may be positioning.  IMPRESSION: Nonspecific white matter changes. Cannot exclude a subtle loss of gray-white distinction within the right parietal lobe. No intraparenchymal hemorrhage.  Recommend MRI if concern for acute ischemia persists.   Original Report Authenticated By: Jearld Lesch, M.D.     Medications:  Scheduled:   . antiseptic oral rinse  15 mL Mouth Rinse QID  . chlorhexidine  15 mL Mouth Rinse BID  . fluconazole (DIFLUCAN) IV  200 mg Intravenous Q24H  . insulin aspart  2-6 Units Subcutaneous Q4H  . [COMPLETED] levetiracetam  1,000 mg Intravenous Once  . levetiracetam  1,500 mg Intravenous Q12H  . pantoprazole (PROTONIX) IV  40 mg Intravenous Q24H  . piperacillin-tazobactam (ZOSYN)  IV  3.375 g Intravenous Q8H  . valproate sodium  400 mg Intravenous Q8H  . vancomycin  1,500 mg Intravenous Q24H  . [DISCONTINUED] levetiracetam  750 mg Intravenous Q12H  . [DISCONTINUED] vancomycin  2,500 mg Intravenous Once   Continuous:   . sodium chloride 10 mL/hr at 02/03/12 0920  . sodium chloride 10 mL/hr at 02/03/12 1200  . sodium chloride 20 mL/hr at 01/24/2012 2021  . fentaNYL infusion INTRAVENOUS 50 mcg/hr (02/03/12 0700)  . midazolam (VERSED) infusion 8 mg/hr (02/04/12 0543)  . norepinephrine (LEVOPHED) Adult infusion 9 mcg/min (02/04/12 0500)  . vasopressin (PITRESSIN) infusion - *FOR SHOCK* 0.03 Units/min (02/03/12 1124)   PRN:  Assessment/Plan: Anoxic brain damage, severe, as well as new-onset seizure activity and sepsis. Propofol was discontinued because of hypotension. Patient is being maintained on Versed and fentanyl, in addition to Depakote and Keppra. He is no  longer exhibiting myoclonus.  CT scan of the head showed equivocal early signs of ischemia. Prognosis for recovery is poor at this point.  Plan: 1. Repeat EEG to rule out ongoing seizure activity as well as assess severity of encephalopathy. 2. Depakote level. 3. Anticonvulsant management dependent on the results of above tests.  C.R. Roseanne Reno, MD [facility] [phone]  02/04/2012  8:42 AM

## 2012-02-05 ENCOUNTER — Inpatient Hospital Stay (HOSPITAL_COMMUNITY): Payer: Medicare Other

## 2012-02-05 LAB — BLOOD GAS, ARTERIAL
Acid-base deficit: 8.8 mmol/L — ABNORMAL HIGH (ref 0.0–2.0)
Bicarbonate: 16.3 mEq/L — ABNORMAL LOW (ref 20.0–24.0)
Bicarbonate: 16.7 mEq/L — ABNORMAL LOW (ref 20.0–24.0)
FIO2: 90 %
FIO2: 0.7 %
O2 Saturation: 98.6 %
O2 Saturation: 98.2 %
PEEP: 14 cmH2O
PEEP: 12 cmH2O
Patient temperature: 98.6
TCO2: 17.8 mmol/L (ref 0–100)
pCO2 arterial: 34 mmHg — ABNORMAL LOW (ref 35.0–45.0)
pO2, Arterial: 161 mmHg — ABNORMAL HIGH (ref 80.0–100.0)
pO2, Arterial: 92.9 mmHg (ref 80.0–100.0)

## 2012-02-05 LAB — CULTURE, RESPIRATORY W GRAM STAIN

## 2012-02-05 LAB — CBC
HCT: 22.1 % — ABNORMAL LOW (ref 39.0–52.0)
Hemoglobin: 7.8 g/dL — ABNORMAL LOW (ref 13.0–17.0)
MCH: 28.9 pg (ref 26.0–34.0)
MCHC: 35.3 g/dL (ref 30.0–36.0)
MCV: 81.9 fL (ref 78.0–100.0)
RBC: 2.7 MIL/uL — ABNORMAL LOW (ref 4.22–5.81)

## 2012-02-05 LAB — POCT I-STAT 3, ART BLOOD GAS (G3+)
Acid-base deficit: 9 mmol/L — ABNORMAL HIGH (ref 0.0–2.0)
Bicarbonate: 16.3 mEq/L — ABNORMAL LOW (ref 20.0–24.0)
O2 Saturation: 94 %
pCO2 arterial: 31.1 mmHg — ABNORMAL LOW (ref 35.0–45.0)
pO2, Arterial: 77 mmHg — ABNORMAL LOW (ref 80.0–100.0)

## 2012-02-05 LAB — MAGNESIUM: Magnesium: 2.1 mg/dL (ref 1.5–2.5)

## 2012-02-05 LAB — BASIC METABOLIC PANEL
Chloride: 106 mEq/L (ref 96–112)
GFR calc Af Amer: 14 mL/min — ABNORMAL LOW (ref >90)
GFR calc non Af Amer: 12 mL/min — ABNORMAL LOW (ref >90)
Potassium: 3.8 mEq/L (ref 3.5–5.1)
Sodium: 135 mEq/L (ref 135–145)

## 2012-02-05 LAB — GLUCOSE, CAPILLARY
Glucose-Capillary: 106 mg/dL — ABNORMAL HIGH (ref 70–99)
Glucose-Capillary: 92 mg/dL (ref 70–99)
Glucose-Capillary: 101 mg/dL — ABNORMAL HIGH (ref 70–99)

## 2012-02-05 NOTE — Progress Notes (Signed)
Spoke with the family, informed that neurology sees no reasonable chance in recovery here and is recommending withdrawal of care.  They are absolutely refusing trach/peg but are asking for more time to make decision regarding with to withdrawal.  Family will let us know tomorrow.  Alyson Reedy, M.D. [facility] Pulmonary/Critical Care Medicine. Pager: [phone]. After hours pager: [phone].

## 2012-02-05 NOTE — Progress Notes (Signed)
Continuous EEG in progress, day 2.  Checked impedances/ electrodes/ video.  Continue monitoring overnight per Dr. Thad Ranger.

## 2012-02-05 NOTE — Progress Notes (Signed)
Family decided that they wish patient be a full NCB per RN.  Will make a full NCB and withdrawal of life supporting measures whenever the family is ready.  Alyson Reedy, M.D. [facility] Pulmonary/Critical Care Medicine. Pager: [phone]. After hours pager: [phone].

## 2012-02-05 NOTE — Progress Notes (Signed)
Name: Jeremiah Rivera MRN: [id number] DOB: 07/15/1935    LOS: 3  Referring Provider:  [facility] surgery. Reason for Referral:  Cardiac arrest and respiratory failure  PULMONARY / CRITICAL CARE MEDICINE  HPI:  76 year old presented to [facility] 10 days ago with a gangrenous galbladder that was removed followed by ERCP.  The patient was admitted to the floor where he had a presumed aspiration event and cardiac arrest.  Code x12 minutes with subsequent myoclonus.  At the family's request patient was transferred to [facility].  Past Medical History  Diagnosis Date  . Hypertension   . Diabetes mellitus without complication   . Hypercholesterolemia    Past Surgical History  Procedure Date  . Cholecystectomy     Review Of Systems:  Unattainable  Events Since Admission: 11/17 Cardiac arrest and transfer to [facility] 11/18 CT of Head, EEG showing severe hypoxic encephalopathy  Current Status: Critical  Vital Signs: Temp:  [97.8 F (36.6 C)-98.8 F (37.1 C)] 97.8 F (36.6 C) (11/20 0800) Pulse Rate:  [57-72] 59  (11/20 1100) Resp:  [18-30] 30  (11/20 1100) BP: (100-152)/(43-65) 152/50 mmHg (11/20 0802) SpO2:  [92 %-100 %] 96 % (11/20 1100) FiO2 (%):  [60 %-90 %] 60 % (11/20 0802) Weight:  [124.1 kg (273 lb 9.5 oz)] 124.1 kg (273 lb 9.5 oz) (11/20 0459) CVP:  [13 mmHg-21 mmHg] 18 mmHg   Physical Examination: General:  Obese male with distended abdomen. Neuro:  No corneal, no gag, no cough HEENT:  [place]/AT, EOM-I spontaneous  Neck:  Supple, -LAN and -thyromegally. Cardiovascular:  RRR, Nl S1/S2, -M/R/G., pulses +2 Lungs:  Coarse BS diffusely. Abdomen:  Distended, hypertympanic, -BS. Musculoskeletal:  -edema and -tenderness. Skin:  Intact.  Active Problems:  Acute respiratory failure  Metabolic acidosis  Anoxic brain injury  Altered mental status  Cardiac arrest  Myoclonus  Renal failure  Anoxic brain damage   ASSESSMENT AND PLAN  PULMONARY  Lab 02/05/12 1023 02/05/12 0645 02/05/12  0500 02/04/12 0349 02/03/12 2021  PHART 7.326* 7.308* 7.303* 7.303* 7.319*  PCO2ART 31.1* 34.3* 34.0* 36.3 34.6*  PO2ART 77.0* 92.9 161.0* 112.0* 49.0*  HCO3 16.3* 16.7* 16.3* 17.5* 17.7*  O2SAT 94.0 98.2 98.6 97.3 82.0   Ventilator Settings: Vent Mode:  [-] PRVC FiO2 (%):  [60 %-90 %] 60 % Set Rate:  [30 bmp] 30 bmp Vt Set:  [450 mL] 450 mL PEEP:  [10 cmH20-18 cmH20] 10 cmH20 Plateau Pressure:  [27 cmH20-34 cmH20] 27 cmH20 CXR:  11/18>>>Airspace disease with basilar atelectasis and probable effusions. ETT:  11/17>>>  A:  Respiratory failure secondary to aspiration resulting in cardiac arrest. P:   Full vent support until decision on withdrawal is made. Continue ARDS protocol. Suction and lavage as needed. ABG and CXR in AM. Continue FiO2 of 50% and PEEP of 8 cmH2O.  CARDIOVASCULAR  Lab 02/04/12 0940 02/04/12 0350 02/03/12 2023 01/25/2012 1820 01/24/2012 1812  TROPONINI 0.54* 0.58* 0.63* 0.35* --  LATICACIDVEN -- -- -- 1.7 1.8  PROBNP -- -- -- 2816.0* --   ECG:  11/17>>>sinus brady with PVCs Lines: R radial a-line 11/17>>>  L [place] TLC 11/17>>>  A: Septic Shock - now on both Vasopressin and norepinephrine.  CVP noted. Cortisol 23.2.  Not a candidate for hypothermia r/t sepsis.  P:  Continue pressor support. CVP monitoring Q4. IVF. 2D echo done and noted. See infection section.  RENAL  Lab 02/05/12 0440 02/04/12 0350 02/03/12 2024 02/03/12 0231 02/03/12 0151 01/23/2012 1820  NA 135 136 134*  130* 137 --  K 3.8 4.2 -- -- -- --  CL 106 105 105 100 106 --  CO2 17* 18* 17* 18* -- 20  BUN 42* 34* 31* 27* 28* --  CREATININE 4.42* 3.51* 3.23* 2.64* 2.90* --  CALCIUM 7.1* 7.2* 7.0* 7.1* -- 7.4*  MG 2.1 2.2 -- 2.0 -- 2.0  PHOS 4.6 3.9 -- 2.8 -- 2.1*   Intake/Output      11/19 0701 - 11/20 0700 11/20 0701 - 11/21 0700   I.V. (mL/kg) 1332.4 (10.7) 187.1 (1.5)   Other     IV Piggyback 674 50   Total Intake(mL/kg) 2006.4 (16.2) 237.1 (1.9)   Urine (mL/kg/hr) 359 (0.1) 110 (0.2)    Drains 12    Total Output 371 110   Net +1635.4 +127.1         Foley:  In place from [facility].  A:  Acute renal failure presumed, baseline unknown. P:   Monitor electrolytes and Cr. Replace as needed. Gentle hydration NS at 50 an hour when counting all KVOs.  GASTROINTESTINAL  Lab 02/03/12 2024 02/07/2012 1820  AST 50* 49*  ALT 51 63*  ALKPHOS 163* 202*  BILITOT 0.9 1.6*  PROT 5.4* 4.8*  ALBUMIN 1.6* 1.7*    A:  Distended post a gangrenous galbladder. Post-operative Ileus Protein malnutrition - nutrition recommends initiation of TPN  P:   Surgery's input appreciated, signed off and if neuro status improves will call. NG to LIWS NPO, if not going to be withdrawing will start TPN. Monitor.  HEMATOLOGIC  Lab 02/05/12 0440 02/04/12 0350 02/03/12 2024 02/03/12 0231 02/03/12 0151 02/10/2012 1820  HGB 7.8* 8.2* 8.3* 8.6* 9.2* --  HCT 22.1* 23.9* 23.4* 25.2* 27.0* --  PLT 187 231 170 247 -- 258  INR -- -- -- -- -- 1.17  APTT -- -- -- -- -- 28   A:  Leukocytosis likely from sepsis. Anemia P:  Monitor CBC. Ttransfusion for HGB less than 7.  INFECTIOUS  Lab 02/05/12 0440 02/04/12 0350 02/03/12 2024 02/03/12 0231 02/07/2012 1820  WBC 17.6* 24.8* 23.9* 26.3* 20.6*  PROCALCITON -- -- -- -- --   Cultures: Blood 11/17>>> Sputum 11/17>>>KLEBSIELLA OXYTOCA, sensitivities noted.  Antibiotics: Vanc 11/17>>> Zosyn 11/17>>> Diflucan11/17>>>  A:  Septic Shock - Abdominal source and aspiration PNA. P:   Abx as above. Will tailor for cultures results (as noted above). CT of ABD when more stable if neuro situation improves.  ENDOCRINE  Lab 02/05/12 1145 02/05/12 0815 02/05/12 0353 02/04/12 2344 02/04/12 1943  GLUCAP 107* 95 106* 108* 109*   A:  Diabetes history.   P:   ISS CBG  NEUROLOGIC  A:  Acute encephalopathy -  Likely severe anoxic injury however, CT of head 11/18 shows nonspecific white matter changes and no intraparenchymal hemorrhage. EEG shows signs  consistent with anoxic injury.  P:   Neuro following, feels prognosis is grim. Continue Keppra. Continue Depakote. Sedation being held now, neuro agrees that if myoclonic activities restart then will recommend withdrawal.  BEST PRACTICE / DISPOSITION Level of Care:  ICU Primary Service:  PCCM Consultants:  Surgery and Neuro Code Status:  Full Diet:  NPO DVT Px:  SCDs GI Px:  Protonix. Skin Integrity:  Intact. Social / Family: Updated   Sons to come in between 2 and 3 PM today and will get with neuro for prognostication then make recommendations.  CC time 35 min.  Alyson Reedy, M.D. [facility] Pulmonary/Critical Care Medicine. Pager: [phone]. After hours pager: [phone].

## 2012-02-05 NOTE — Progress Notes (Addendum)
Subjective: No further myoclonic activity noted.  Continuous EEG shows suppression with minimal burst activity.  Patient on Keppra and Depakote.  Sedated with Fentanyl and Versed.  Objective: Current vital signs: BP 152/50  Pulse 60  Temp 97.8 F (36.6 C) (Oral)  Resp 30  Ht 6\' 3"  (1.905 m)  Wt 124.1 kg (273 lb 9.5 oz)  BMI 34.20 kg/m2  SpO2 97% Vital signs in last 24 hours: Temp:  [97.8 F (36.6 C)-98.8 F (37.1 C)] 97.8 F (36.6 C) (11/20 0800) Pulse Rate:  [57-72] 60  (11/20 0930) Resp:  [18-30] 30  (11/20 0930) BP: (100-152)/(43-65) 152/50 mmHg (11/20 0802) SpO2:  [92 %-100 %] 97 % (11/20 0930) FiO2 (%):  [60 %-90 %] 60 % (11/20 0802) Weight:  [124.1 kg (273 lb 9.5 oz)] 124.1 kg (273 lb 9.5 oz) (11/20 0459)  Intake/Output from previous day: 11/19 0701 - 11/20 0700 In: 2006.4 [I.V.:1332.4; IV Piggyback:674] Out: 371 [Urine:359; Drains:12] Intake/Output this shift: Total I/O In: 144.5 [I.V.:94.5; IV Piggyback:50] Out: 60 [Urine:60] Nutritional status: NPO  Neurologic Exam: Mental Status: Patient does not respond to verbal stimuli.  Does not respond to painful stimuli.  Does not follow commands.  No verbalizations noted.  Cranial Nerves: II: patient does not respond confrontation bilaterally, pupils right 3 mm, left 3 mm,and unreactive bilaterally III,IV,VI: doll's response absent bilaterally. V,VII: corneal reflex absent bilaterally  VIII: patient does not respond to verbal stimuli IX,X: gag reflex not tested, XI: trapezius strength unable to test bilaterally XII: tongue strength unable to test Motor: Extremities flaccid throughout.  No spontaneous movement noted.  No purposeful movements noted. Sensory: Does not respond to noxious stimuli in any extremity. Deep Tendon Reflexes:  Absent throughout. Plantars: mute bilaterally Cerebellar: Unable to perform    Lab Results: Basic Metabolic Panel:  Lab 02/05/12 1610 02/04/12 0350 02/03/12 2024 02/03/12 0231  02/03/12 0151 01/19/2012 1820  NA 135 136 134* 130* 137 --  K 3.8 4.2 3.8 3.9 4.3 --  CL 106 105 105 100 106 --  CO2 17* 18* 17* 18* -- 20  GLUCOSE 112* 135* 141* 200* 197* --  BUN 42* 34* 31* 27* 28* --  CREATININE 4.42* 3.51* 3.23* 2.64* 2.90* --  CALCIUM 7.1* 7.2* 7.0* -- -- --  MG 2.1 2.2 -- 2.0 -- 2.0  PHOS 4.6 3.9 -- 2.8 -- 2.1*    Liver Function Tests:  Lab 02/03/12 2024 02/04/2012 1820  AST 50* 49*  ALT 51 63*  ALKPHOS 163* 202*  BILITOT 0.9 1.6*  PROT 5.4* 4.8*  ALBUMIN 1.6* 1.7*    Lab 01/20/2012 1820  LIPASE 50  AMYLASE 82   No results found for this basename: AMMONIA:3 in the last 168 hours  CBC:  Lab 02/05/12 0440 02/04/12 0350 02/03/12 2024 02/03/12 0231 02/03/12 0151 02/01/2012 1820  WBC 17.6* 24.8* 23.9* 26.3* -- 20.6*  NEUTROABS -- -- -- -- -- 19.0*  HGB 7.8* 8.2* 8.3* 8.6* 9.2* --  HCT 22.1* 23.9* 23.4* 25.2* 27.0* --  MCV 81.9 80.2 79.6 80.5 -- 81.2  PLT 187 231 170 247 -- 258    Cardiac Enzymes:  Lab 02/04/12 0940 02/04/12 0350 02/03/12 2023 02/01/2012 1820  CKTOTAL -- -- -- 231  CKMB -- -- -- 3.3  CKMBINDEX -- -- -- --  TROPONINI 0.54* 0.58* 0.63* 0.35*    Lipid Panel: No results found for this basename: CHOL:5,TRIG:5,HDL:5,CHOLHDL:5,VLDL:5,LDLCALC:5 in the last 168 hours  CBG:  Lab 02/05/12 0815 02/05/12 0353 02/04/12 2344 02/04/12 1943 02/04/12 1627  GLUCAP 95 106* 108* 109* 120*    Microbiology: Results for orders placed during the hospital encounter of 02/01/2012  MRSA PCR SCREENING     Status: Normal   Collection Time   01/19/2012  5:56 PM      Component Value Range Status Comment   MRSA by PCR NEGATIVE  NEGATIVE Final   CULTURE, BLOOD (ROUTINE X 2)     Status: Normal (Preliminary result)   Collection Time   02/07/2012  6:32 PM      Component Value Range Status Comment   Specimen Description BLOOD HAND RIGHT   Final    Special Requests BOTTLES DRAWN AEROBIC ONLY 10CC   Final    Culture  Setup Time 02/03/2012 08:54   Final    Culture      Final    Value:        BLOOD CULTURE RECEIVED NO GROWTH TO DATE CULTURE WILL BE HELD FOR 5 DAYS BEFORE ISSUING A FINAL NEGATIVE REPORT   Report Status PENDING   Incomplete   CULTURE, BLOOD (ROUTINE X 2)     Status: Normal (Preliminary result)   Collection Time   02/14/2012  6:45 PM      Component Value Range Status Comment   Specimen Description BLOOD CENTRAL LINE   Final    Special Requests BOTTLES DRAWN AEROBIC AND ANAEROBIC 10CC   Final    Culture  Setup Time 02/03/2012 08:54   Final    Culture     Final    Value:        BLOOD CULTURE RECEIVED NO GROWTH TO DATE CULTURE WILL BE HELD FOR 5 DAYS BEFORE ISSUING A FINAL NEGATIVE REPORT   Report Status PENDING   Incomplete   CULTURE, RESPIRATORY     Status: Normal   Collection Time   02/10/2012  9:01 PM      Component Value Range Status Comment   Specimen Description INDUCED SPUTUM   Final    Special Requests NONE   Final    Gram Stain     Final    Value: ABUNDANT WBC PRESENT, PREDOMINANTLY PMN     RARE SQUAMOUS EPITHELIAL CELLS PRESENT     ABUNDANT GRAM NEGATIVE RODS   Culture ABUNDANT KLEBSIELLA OXYTOCA   Final    Report Status 02/05/2012 FINAL   Final    Organism ID, Bacteria KLEBSIELLA OXYTOCA   Final     Coagulation Studies:  Basename 01/28/2012 1820  LABPROT 14.7  INR 1.17    Imaging: Dg Chest Port 1 View  02/05/2012  *RADIOLOGY REPORT*  Clinical Data: Endotracheal tube.  PORTABLE CHEST - 1 VIEW  Comparison: 02/04/2012.  Findings: Endotracheal tube tip 3 cm above the carina.  Overlying the lead slightly limits evaluation.  Left central line tip distal superior vena cava level.  No gross pneumothorax.  Asymmetric air space disease greater on the right.  This may represent asymmetric pulmonary edema.  Infectious infiltrate not excluded.  Evaluation of the lung bases is limited. Follow-up until clearance recommended.  Heart size top normal.  IMPRESSION:  ndotracheal tube tip 3 cm above the carina.  Overlying the lead slightly limits  evaluation.  Asymmetric air space disease greater on the right.  This may represent asymmetric pulmonary edema.  Infectious infiltrate not excluded.   Original Report Authenticated By: Lacy Duverney, M.D.    Dg Chest Port 1 View  02/04/2012  *RADIOLOGY REPORT*  Clinical Data: Evaluate endotracheal tube position, shortness of breath  PORTABLE CHEST -  1 VIEW  Comparison: Portable chest x-ray of 02/03/2012  Findings: The tip of the endotracheal tube remains approximately 2.3 cm above the carina.  There is little change in suboptimal aeration and basilar opacities most consistent with atelectasis and effusions, right greater than left.  A left central venous line and an NG tube remain.  IMPRESSION:  1.  No change in position of endotracheal tube 2.3 cm above the carina. 2.  Little change in airspace disease with basilar atelectasis and probable effusions.   Original Report Authenticated By: Dwyane Dee, M.D.    Dg Chest Port 1 View  02/03/2012  *RADIOLOGY REPORT*  Clinical Data: Respiratory distress  PORTABLE CHEST - 1 VIEW  Comparison: Portable exam 2035 hours compared to 0530 hours  Findings: Tip of endotracheal tube 2.1 cm above carina. Nasogastric tube extends into abdomen. Left subclavian central venous catheter, tip projecting over SVC. Enlargement of cardiac silhouette. Bibasilar atelectasis and probable right pleural effusion. Question perihilar infiltrate right lung. No pneumothorax.  IMPRESSION: No interval change. Bibasilar atelectasis and probable right pleural effusion.   Original Report Authenticated By: Ulyses Southward, M.D.    Dg Chest Port 1 View  02/03/2012  *RADIOLOGY REPORT*  Clinical Data: 76 year old male endotracheal tube readjusted.  PORTABLE CHEST - 1 VIEW  Comparison: 02/06/2012.  Findings: Semi upright portable AP view at 1304 hours.  The patient is less rotated to the right.  Endotracheal tube tip in good position between the level of clavicles and carina.  Resuscitation pad on the left.   Lower lung volumes.  Stable left subclavian central line.  No pneumothorax.  Probable right pleural effusion. Stable cardiac size and mediastinal contours.  Decreased bibasilar ventilation.  Enteric tube courses to the abdomen, tip not included.  IMPRESSION: 1.  Endotracheal tube tip in good position. 2. Otherwise, stable lines and tubes. 3.  Lower lung volumes.  Right pleural effusion may be increased.   Original Report Authenticated By: Erskine Speed, M.D.     Medications:  I have reviewed the patient's current medications. Scheduled:   . antiseptic oral rinse  15 mL Mouth Rinse QID  . chlorhexidine  15 mL Mouth Rinse BID  . fluconazole (DIFLUCAN) IV  200 mg Intravenous Q24H  . insulin aspart  2-6 Units Subcutaneous Q4H  . levetiracetam  1,500 mg Intravenous Q12H  . pantoprazole (PROTONIX) IV  40 mg Intravenous Q24H  . piperacillin-tazobactam (ZOSYN)  IV  3.375 g Intravenous Q8H  . [COMPLETED] valproate sodium  500 mg Intravenous Once  . valproate sodium  750 mg Intravenous Q12H  . vancomycin  1,500 mg Intravenous Q24H  . [DISCONTINUED] valproate sodium  400 mg Intravenous Q8H  . [DISCONTINUED] vancomycin  1,500 mg Intravenous Q24H    Assessment/Plan:  Patient Active Hospital Problem List:  Myoclonus (02/07/2012)   Assessment: Patient suppressed.  No further myoclonic activity noted.  Depakote level 38.6.   Plan:  1.  Would start to wean sedation when medically reasonable to determine if myoclonic activty will return.  Activity difficult to control and does suggest an extremely poor prognosis.  Will be able to monitor with continuous EEG that is currently in progress.  Anoxic brain damage (02/04/2012)   Assessment: Suspected with development of myoclonic activity and poor mental status.  Is now 3 days s/p PEA arrest.  Last cerebral imaging was performed on 11/18 and shows nonspecific white matter changes.  Evidence of other end organ damage present.     Plan:  1.  Repeat head  CT  2.   Slow weaning of sedation.  Unable to fully evaluate for brain death in the clinical setting of his extensive sedation.      LOS: 3 days   Thana Farr, MD [facility] [phone] 02/05/2012  9:42 AM

## 2012-02-06 ENCOUNTER — Inpatient Hospital Stay (HOSPITAL_COMMUNITY): Payer: Medicare Other

## 2012-02-06 LAB — BLOOD GAS, ARTERIAL
Bicarbonate: 15 mEq/L — ABNORMAL LOW (ref 20.0–24.0)
MECHVT: 500 mL
O2 Saturation: 89.3 %
PEEP: 10 cmH2O
Patient temperature: 97.5
RATE: 30 resp/min
pH, Arterial: 7.347 — ABNORMAL LOW (ref 7.350–7.450)

## 2012-02-06 LAB — GLUCOSE, CAPILLARY
Glucose-Capillary: 101 mg/dL — ABNORMAL HIGH (ref 70–99)
Glucose-Capillary: 108 mg/dL — ABNORMAL HIGH (ref 70–99)

## 2012-02-06 LAB — BASIC METABOLIC PANEL
CO2: 15 mEq/L — ABNORMAL LOW (ref 19–32)
CO2: 15 mEq/L — ABNORMAL LOW (ref 19–32)
Calcium: 7.4 mg/dL — ABNORMAL LOW (ref 8.4–10.5)
Chloride: 107 mEq/L (ref 96–112)
Creatinine, Ser: 4.87 mg/dL — ABNORMAL HIGH (ref 0.50–1.35)
GFR calc non Af Amer: 11 mL/min — ABNORMAL LOW (ref >90)
Glucose, Bld: 105 mg/dL — ABNORMAL HIGH (ref 70–99)
Glucose, Bld: 124 mg/dL — ABNORMAL HIGH (ref 70–99)
Sodium: 137 mEq/L (ref 135–145)
Sodium: 139 mEq/L (ref 135–145)

## 2012-02-06 LAB — POCT I-STAT 3, ART BLOOD GAS (G3+)
Acid-base deficit: 10 mmol/L — ABNORMAL HIGH (ref 0.0–2.0)
O2 Saturation: 84 %
Patient temperature: 98.6
pO2, Arterial: 54 mmHg — ABNORMAL LOW (ref 80.0–100.0)

## 2012-02-06 LAB — CBC
Hemoglobin: 8.1 g/dL — ABNORMAL LOW (ref 13.0–17.0)
MCV: 79.5 fL (ref 78.0–100.0)
Platelets: 205 10*3/uL (ref 150–400)
RBC: 2.92 MIL/uL — ABNORMAL LOW (ref 4.22–5.81)
WBC: 16.1 10*3/uL — ABNORMAL HIGH (ref 4.0–10.5)

## 2012-02-06 LAB — MAGNESIUM: Magnesium: 2.2 mg/dL (ref 1.5–2.5)

## 2012-02-06 LAB — PHOSPHORUS: Phosphorus: 5.8 mg/dL — ABNORMAL HIGH (ref 2.3–4.6)

## 2012-02-06 MED ORDER — METRONIDAZOLE IN NACL 5-0.79 MG/ML-% IV SOLN
500.0000 mg | Freq: Three times a day (TID) | INTRAVENOUS | Status: DC
  Administered 2012-02-06 – 2012-02-07 (×3): 500 mg via INTRAVENOUS
  Filled 2012-02-06 (×6): qty 100

## 2012-02-06 MED ORDER — SODIUM BICARBONATE 8.4 % IV SOLN
INTRAVENOUS | Status: DC
  Administered 2012-02-06 – 2012-02-07 (×2): via INTRAVENOUS
  Filled 2012-02-06 (×4): qty 100

## 2012-02-06 MED ORDER — SODIUM CHLORIDE 0.45 % IV SOLN: INTRAVENOUS | Status: DC

## 2012-02-06 MED ORDER — PROPOFOL 10 MG/ML IV EMUL
5.0000 ug/kg/min | INTRAVENOUS | Status: DC
  Administered 2012-02-06: 5 ug/kg/min via INTRAVENOUS
  Administered 2012-02-07 – 2012-02-08 (×3): 10 ug/kg/min via INTRAVENOUS
  Filled 2012-02-06 (×4): qty 100

## 2012-02-06 MED ORDER — MIDAZOLAM HCL 2 MG/2ML IJ SOLN
1.0000 mg | INTRAMUSCULAR | Status: DC | PRN
  Administered 2012-02-06 (×2): 2 mg via INTRAVENOUS
  Filled 2012-02-06 (×2): qty 2

## 2012-02-06 MED ORDER — LEVOFLOXACIN IN D5W 750 MG/150ML IV SOLN
750.0000 mg | INTRAVENOUS | Status: DC
  Administered 2012-02-06: 750 mg via INTRAVENOUS
  Filled 2012-02-06: qty 150

## 2012-02-06 MED ORDER — FENTANYL CITRATE 0.05 MG/ML IJ SOLN
25.0000 ug | INTRAMUSCULAR | Status: DC | PRN
  Administered 2012-02-06 (×2): 50 ug via INTRAVENOUS
  Filled 2012-02-06 (×2): qty 2

## 2012-02-06 MED ORDER — SODIUM POLYSTYRENE SULFONATE 15 GM/60ML PO SUSP
45.0000 g | ORAL | Status: AC
  Administered 2012-02-06: 45 g
  Filled 2012-02-06: qty 180

## 2012-02-06 NOTE — Progress Notes (Signed)
Nutrition Follow-up  Intervention:   1. Recommend if family desires to continue aggressive care, initiate nutrition support (EN vs TPN).   2. RD will continue to follow    Assessment:   Remains intubated, off sedation for >24 hrs and remains unresponsive. Poor prognosis per Neuro. Per CCM, family to make a decision regarding end of life soon.   Pt remains NPO, no TF or TPN. Intubated on 11/17, now 4 days with out nutrition support. Recommend if family decides to continue care, initiate nutrition support.   Diet Order:  NPO   Meds: Scheduled Meds:   . antiseptic oral rinse  15 mL Mouth Rinse QID  . chlorhexidine  15 mL Mouth Rinse BID  . fluconazole (DIFLUCAN) IV  200 mg Intravenous Q24H  . insulin aspart  2-6 Units Subcutaneous Q4H  . levetiracetam  1,500 mg Intravenous Q12H  . levofloxacin (LEVAQUIN) IV  750 mg Intravenous Q48H  . metronidazole  500 mg Intravenous Q8H  . pantoprazole (PROTONIX) IV  40 mg Intravenous Q24H  . [COMPLETED] sodium polystyrene  45 g Per Tube STAT  . valproate sodium  750 mg Intravenous Q12H  . [DISCONTINUED] piperacillin-tazobactam (ZOSYN)  IV  3.375 g Intravenous Q8H  . [DISCONTINUED] vancomycin  1,500 mg Intravenous Q24H   Continuous Infusions:   . sodium chloride    . [DISCONTINUED] sodium chloride 10 mL/hr at 02/05/12 1200  . [DISCONTINUED] sodium chloride 10 mL/hr at 02/05/12 1200  . [DISCONTINUED] sodium chloride 20 mL/hr at 02/04/12 2257  . [DISCONTINUED] fentaNYL infusion INTRAVENOUS Stopped (02/05/12 1200)  . [DISCONTINUED] midazolam (VERSED) infusion Stopped (02/05/12 1200)  . [DISCONTINUED] norepinephrine (LEVOPHED) Adult infusion Stopped (02/05/12 1330)  . [DISCONTINUED] vasopressin (PITRESSIN) infusion - *FOR SHOCK* Stopped (02/04/12 1430)   PRN Meds:.fentaNYL, midazolam   CMP     Component Value Date/Time   NA 137 02/06/2012 0420   K 5.9* 02/06/2012 0420   CL 107 02/06/2012 0420   CO2 15* 02/06/2012 0420   GLUCOSE 105*  02/06/2012 0420   BUN 47* 02/06/2012 0420   CREATININE 4.67* 02/06/2012 0420   CALCIUM 7.0* 02/06/2012 0420   PROT 5.4* 02/03/2012 2024   ALBUMIN 1.6* 02/03/2012 2024   AST 50* 02/03/2012 2024   ALT 51 02/03/2012 2024   ALKPHOS 163* 02/03/2012 2024   BILITOT 0.9 02/03/2012 2024   GFRNONAA 11* 02/06/2012 0420   GFRAA 13* 02/06/2012 0420   Sodium  Date/Time Value Range Status  02/06/2012  4:20 AM 137  135 - 145 mEq/L Final  02/05/2012  4:40 AM 135  135 - 145 mEq/L Final  02/04/2012  3:50 AM 136  135 - 145 mEq/L Final    Potassium  Date/Time Value Range Status  02/06/2012  4:20 AM 5.9* 3.5 - 5.1 mEq/L Final     DELTA CHECK NOTED  02/05/2012  4:40 AM 3.8  3.5 - 5.1 mEq/L Final  02/04/2012  3:50 AM 4.2  3.5 - 5.1 mEq/L Final    Phosphorus  Date/Time Value Range Status  02/06/2012  4:20 AM 5.8* 2.3 - 4.6 mg/dL Final  45/40/9811  9:14 AM 4.6  2.3 - 4.6 mg/dL Final  78/29/5621  3:08 AM 3.9  2.3 - 4.6 mg/dL Final    Magnesium  Date/Time Value Range Status  02/06/2012  4:20 AM 2.2  1.5 - 2.5 mg/dL Final  65/78/4696  2:95 AM 2.1  1.5 - 2.5 mg/dL Final  28/41/3244  0:10 AM 2.2  1.5 - 2.5 mg/dL Final  CBG (last 3)   Basename 02/06/12 1149 02/06/12 0740 02/06/12 0343  GLUCAP 108* 110* 101*     Intake/Output Summary (Last 24 hours) at 02/06/12 1203 Last data filed at 02/06/12 0800  Gross per 24 hour  Intake 1623.75 ml  Output    800 ml  Net 823.75 ml   Patient is currently intubated on ventilator support.  MV: 14.7  Temp:Temp (24hrs), Avg:97.6 F (36.4 C), Min:97.4 F (36.3 C), Max:98 F (36.7 C)  Propofol: none    Weight Status:  278 lbs, trending up   Re-estimated needs:  2458 kcal, 160-185 gm protein  (underfeeding goal: 1475-1720 kcal )  Nutrition Dx:  Inadequate oral intake r/t inability to eat AEB NPO.  Goal:  Enteral nutrition to provide 60-70% of estimated calorie needs (22-25 kcals/kg ideal body weight) and >/= 90% of estimated protein needs,  based on ASPEN guidelines for permissive underfeeding in critically ill obese individuals.   Monitor:  Initiation of nutrition support, vent status, GOC   Clarene Duke RD, LDN Pager [phone] After Hours pager [phone]

## 2012-02-06 NOTE — Progress Notes (Signed)
eLink Physician-Brief Progress Note Patient Name: Jeremiah Rivera DOB: May 15, 1935 MRN: [id number]  Date of Service  02/06/2012   HPI/Events of Note  Hypertensive - biting on ETT Off continuous sedation   eICU Interventions  Plan: Placed on intermittent sedation   Intervention Category Minor Interventions: Agitation / anxiety - evaluation and management  Gid Schoffstall 02/06/2012, 3:05 AM

## 2012-02-06 NOTE — Progress Notes (Signed)
eLink Physician-Brief Progress Note Patient Name: Jeremiah Rivera DOB: 04-07-1935 MRN: [id number]  Date of Service  02/06/2012   HPI/Events of Note   venty dynschrony,   eICU Interventions  Start diprivan   Intervention Category Major Interventions: Respiratory failure - evaluation and management  Monti Jilek 02/06/2012, 10:13 PM

## 2012-02-06 NOTE — Progress Notes (Signed)
Elink called and notified of HR fluctuation between 45-47. Pt SBP > 100 at this time. Will continue to monitor.

## 2012-02-06 NOTE — Progress Notes (Signed)
Subjective: Patient remains unresponsive.  Has not had any further sedatives since yesterday.  EEG reviewed and is unchanged from when on sedatives.  EEG shows generalized sharp transients alternating with an attenuated background activity.  The periods of attenuation last up to 30 seconds.  There is no activation with stimulation.    Objective: Current vital signs: BP 164/55  Pulse 56  Temp 97.5 F (36.4 C) (Oral)  Resp 30  Ht 6\' 3"  (1.905 m)  Wt 126.2 kg (278 lb 3.5 oz)  BMI 34.78 kg/m2  SpO2 94% Vital signs in last 24 hours: Temp:  [97.4 F (36.3 C)-98 F (36.7 C)] 97.5 F (36.4 C) (11/21 0804) Pulse Rate:  [53-69] 56  (11/21 0900) Resp:  [19-40] 30  (11/21 0900) BP: (152-164)/(50-55) 164/55 mmHg (11/21 0804) SpO2:  [89 %-97 %] 94 % (11/21 0900) FiO2 (%):  [50 %-60 %] 60 % (11/21 0804) Weight:  [126.2 kg (278 lb 3.5 oz)] 126.2 kg (278 lb 3.5 oz) (11/21 0400)  Intake/Output from previous day: 11/20 0701 - 11/21 0700 In: 1824.2 [I.V.:996.7; IV Piggyback:827.5] Out: 635 [Urine:625; Drains:10] Intake/Output this shift: Total I/O In: 82.5 [I.V.:40; NG/GT:30; IV Piggyback:12.5] Out: 300 [Urine:300] Nutritional status: NPO  Neurologic Exam: Mental Status: Patient does not respond to verbal stimuli.  Does not respond to deep sternal rub.  Does not follow commands.  No verbalizations noted.  Cranial Nerves: II: patient does not respond confrontation bilaterally, pupils right 4 mm, left 4 mm,and sluggishly reactive bilaterally III,IV,VI: doll's response absent bilaterally.  V,VII: corneal reflex absent bilaterally  VIII: patient does not respond to verbal stimuli IX,X: gag reflex not tested, XI: trapezius strength unable to test bilaterally XII: tongue strength unable to test Motor: Extremities flaccid throughout.  No spontaneous movement noted.  No purposeful movements noted. Sensory: Does not respond to noxious stimuli in any extremity. Deep Tendon Reflexes:  2+ at the  knees Plantars: mute bilaterally Cerebellar: Unable to perform    Lab Results: Basic Metabolic Panel:  Lab 02/06/12 1610 02/05/12 0440 02/04/12 0350 02/03/12 2024 02/03/12 0231 01/24/2012 1820  NA 137 135 136 134* 130* --  K 5.9* 3.8 4.2 3.8 3.9 --  CL 107 106 105 105 100 --  CO2 15* 17* 18* 17* 18* --  GLUCOSE 105* 112* 135* 141* 200* --  BUN 47* 42* 34* 31* 27* --  CREATININE 4.67* 4.42* 3.51* 3.23* 2.64* --  CALCIUM 7.0* 7.1* 7.2* -- -- --  MG 2.2 2.1 2.2 -- 2.0 2.0  PHOS 5.8* 4.6 3.9 -- 2.8 2.1*    Liver Function Tests:  Lab 02/03/12 2024 02/06/2012 1820  AST 50* 49*  ALT 51 63*  ALKPHOS 163* 202*  BILITOT 0.9 1.6*  PROT 5.4* 4.8*  ALBUMIN 1.6* 1.7*    Lab 02/12/2012 1820  LIPASE 50  AMYLASE 82   No results found for this basename: AMMONIA:3 in the last 168 hours  CBC:  Lab 02/06/12 0420 02/05/12 0440 02/04/12 0350 02/03/12 2024 02/03/12 0231 02/10/2012 1820  WBC 16.1* 17.6* 24.8* 23.9* 26.3* --  NEUTROABS -- -- -- -- -- 19.0*  HGB 8.1* 7.8* 8.2* 8.3* 8.6* --  HCT 23.2* 22.1* 23.9* 23.4* 25.2* --  MCV 79.5 81.9 80.2 79.6 80.5 --  PLT 205 187 231 170 247 --    Cardiac Enzymes:  Lab 02/04/12 0940 02/04/12 0350 02/03/12 2023 09-Feb-2012 1820  CKTOTAL -- -- -- 231  CKMB -- -- -- 3.3  CKMBINDEX -- -- -- --  TROPONINI 0.54* 0.58*  0.63* 0.35*    Lipid Panel: No results found for this basename: CHOL:5,TRIG:5,HDL:5,CHOLHDL:5,VLDL:5,LDLCALC:5 in the last 168 hours  CBG:  Lab 02/06/12 0740 02/06/12 0343 02/05/12 2343 02/05/12 2104 02/05/12 1623  GLUCAP 110* 101* 94 101* 92    Microbiology: Results for orders placed during the hospital encounter of 02/05/2012  MRSA PCR SCREENING     Status: Normal   Collection Time   02/11/2012  5:56 PM      Component Value Range Status Comment   MRSA by PCR NEGATIVE  NEGATIVE Final   CULTURE, BLOOD (ROUTINE X 2)     Status: Normal (Preliminary result)   Collection Time   01/22/2012  6:32 PM      Component Value Range Status  Comment   Specimen Description BLOOD HAND RIGHT   Final    Special Requests BOTTLES DRAWN AEROBIC ONLY 10CC   Final    Culture  Setup Time 02/03/2012 08:54   Final    Culture     Final    Value:        BLOOD CULTURE RECEIVED NO GROWTH TO DATE CULTURE WILL BE HELD FOR 5 DAYS BEFORE ISSUING A FINAL NEGATIVE REPORT   Report Status PENDING   Incomplete   CULTURE, BLOOD (ROUTINE X 2)     Status: Normal (Preliminary result)   Collection Time   01/28/2012  6:45 PM      Component Value Range Status Comment   Specimen Description BLOOD CENTRAL LINE   Final    Special Requests BOTTLES DRAWN AEROBIC AND ANAEROBIC 10CC   Final    Culture  Setup Time 02/03/2012 08:54   Final    Culture     Final    Value:        BLOOD CULTURE RECEIVED NO GROWTH TO DATE CULTURE WILL BE HELD FOR 5 DAYS BEFORE ISSUING A FINAL NEGATIVE REPORT   Report Status PENDING   Incomplete   CULTURE, RESPIRATORY     Status: Normal   Collection Time   02/07/2012  9:01 PM      Component Value Range Status Comment   Specimen Description INDUCED SPUTUM   Final    Special Requests NONE   Final    Gram Stain     Final    Value: ABUNDANT WBC PRESENT, PREDOMINANTLY PMN     RARE SQUAMOUS EPITHELIAL CELLS PRESENT     ABUNDANT GRAM NEGATIVE RODS   Culture ABUNDANT KLEBSIELLA OXYTOCA   Final    Report Status 02/05/2012 FINAL   Final    Organism ID, Bacteria KLEBSIELLA OXYTOCA   Final     Coagulation Studies: No results found for this basename: LABPROT:5,INR:5 in the last 72 hours  Imaging: Dg Chest Port 1 View  02/06/2012  *RADIOLOGY REPORT*  Clinical Data: Check endotracheal tube position.  PORTABLE CHEST - 1 VIEW  Comparison: 02/05/2012  Findings: Endotracheal tube is roughly 2.5 cm above the carina. There are low lung volumes.  Patchy densities throughout the right hemithorax and persistent bibasilar opacities.  The heart size is grossly stable for size.  There is a left subclavian central line tip near the cavoatrial junction.   Nasogastric tube extends into the abdomen.  The tip of the nasogastric tube appears to be in the gastric fundus region.  IMPRESSION: Diffuse airspace disease in the right lung and persistent bibasilar opacities.  Support apparatuses as described.   Original Report Authenticated By: Richarda Overlie, M.D.    Dg Chest Port 1 View  02/05/2012  *  RADIOLOGY REPORT*  Clinical Data: Endotracheal tube.  PORTABLE CHEST - 1 VIEW  Comparison: 02/04/2012.  Findings: Endotracheal tube tip 3 cm above the carina.  Overlying the lead slightly limits evaluation.  Left central line tip distal superior vena cava level.  No gross pneumothorax.  Asymmetric air space disease greater on the right.  This may represent asymmetric pulmonary edema.  Infectious infiltrate not excluded.  Evaluation of the lung bases is limited. Follow-up until clearance recommended.  Heart size top normal.  IMPRESSION:  ndotracheal tube tip 3 cm above the carina.  Overlying the lead slightly limits evaluation.  Asymmetric air space disease greater on the right.  This may represent asymmetric pulmonary edema.  Infectious infiltrate not excluded.   Original Report Authenticated By: Lacy Duverney, M.D.     Medications:  I have reviewed the patient's current medications. Scheduled:   . antiseptic oral rinse  15 mL Mouth Rinse QID  . chlorhexidine  15 mL Mouth Rinse BID  . fluconazole (DIFLUCAN) IV  200 mg Intravenous Q24H  . insulin aspart  2-6 Units Subcutaneous Q4H  . levetiracetam  1,500 mg Intravenous Q12H  . pantoprazole (PROTONIX) IV  40 mg Intravenous Q24H  . piperacillin-tazobactam (ZOSYN)  IV  3.375 g Intravenous Q8H  . sodium polystyrene  45 g Per Tube Once  . valproate sodium  750 mg Intravenous Q12H  . vancomycin  1,500 mg Intravenous Q24H    Assessment/Plan:  Patient Active Hospital Problem List: Myoclonus   Assessment: No further myoclonus noted.  Patient off sedation.     Plan:  1.  Will d/c continuous monitoring Anoxic brain  injury (February 23, 2012)   Assessment: Patient now greater than 24 hours off sedation.  Remains unresponsive.  Brain activity markedly abnormal.  Prognosis remains extremely poor for functional recovery.  Patient also shows signs of multi-organ damage.     Plan:  1.  Would consider discontinuation of support.    Case discussed with Dr. Molli Knock    LOS: 4 days   Thana Farr, MD [facility] [phone] 02/06/2012  9:38 AM

## 2012-02-06 NOTE — Progress Notes (Signed)
Continuous EEG monitoring disconnected per neurology

## 2012-02-06 NOTE — Progress Notes (Addendum)
Pt is asynchronous with the vent. Intermittent sedation was administered per PRN order, pt is still asynchronous on the vent. MD notified of BP and vent dysnchrony. Propofol started per MD order at 5 mcg. Will continue to monitor.

## 2012-02-06 NOTE — Progress Notes (Signed)
Pt BP noted to be 98/55 post start of propofol, and HR of 49. MD notified, instructed to call back if HR decreases to low 40's. Pt is still asynchronous with the vent. Notified RT. Will continue to monitor.

## 2012-02-06 NOTE — Progress Notes (Signed)
Name: Jeremiah Rivera MRN: [id number] DOB: 06/30/1935    LOS: 4  Referring Provider:  [facility] surgery. Reason for Referral:  Cardiac arrest and respiratory failure  PULMONARY / CRITICAL CARE MEDICINE  HPI:  76 year old presented to [facility] with a gangrenous galbladder that was removed followed by ERCP.  The patient was admitted to the floor where he had a presumed aspiration event and cardiac arrest.  Code x12 minutes with subsequent myoclonus.  At the family's request patient was transferred to [facility].  Past Medical History  Diagnosis Date  . Hypertension   . Diabetes mellitus without complication   . Hypercholesterolemia    Past Surgical History  Procedure Date  . Cholecystectomy     Review Of Systems:  Unattainable  Events Since Admission: 11/17 Cardiac arrest and transfer to [facility] 11/18 CT of Head, EEG showing severe hypoxic encephalopathy 11/20- poor neuro recovery, NCB established, worsening k, hypoxia  Current Status: Critical  Vital Signs: Temp:  [97.4 F (36.3 C)-98 F (36.7 C)] 97.5 F (36.4 C) (11/21 0804) Pulse Rate:  [53-69] 56  (11/21 0900) Resp:  [19-40] 30  (11/21 0900) BP: (152-164)/(50-55) 164/55 mmHg (11/21 0804) SpO2:  [89 %-97 %] 94 % (11/21 0900) FiO2 (%):  [50 %-60 %] 60 % (11/21 0804) Weight:  [126.2 kg (278 lb 3.5 oz)] 126.2 kg (278 lb 3.5 oz) (11/21 0400) CVP:  [16 mmHg-18 mmHg] 16 mmHg   Physical Examination: General:  Obese male with distended abdomen. Neuro:  No corneal, no gag, no cough HEENT:  Ett, jvd wnl Neck:  Supple, ett Cardiovascular:  RRR, Nl S1/S2, -M pulses +2 Lungs:  Coarse BS diffusely, rt side worse Abdomen:  Distended, hypertympanic, -BS. Musculoskeletal:  -edema and -tenderness. Skin:  Intact.  Active Problems:  Acute respiratory failure  Metabolic acidosis  Anoxic brain injury  Altered mental status  Cardiac arrest  Myoclonus  Renal failure  Anoxic brain damage   ASSESSMENT AND PLAN  PULMONARY  Lab 02/06/12  0340 02/05/12 1023 02/05/12 0645 02/05/12 0500 02/04/12 0349  PHART 7.284* 7.326* 7.308* 7.303* 7.303*  PCO2ART 33.1* 31.1* 34.3* 34.0* 36.3  PO2ART 54.0* 77.0* 92.9 161.0* 112.0*  HCO3 15.7* 16.3* 16.7* 16.3* 17.5*  O2SAT 84.0 94.0 98.2 98.6 97.3   Ventilator Settings: Vent Mode:  [-] PRVC FiO2 (%):  [50 %-60 %] 60 % Set Rate:  [30 bmp] 30 bmp Vt Set:  [450 mL] 450 mL PEEP:  [8 cmH20-10 cmH20] 10 cmH20 Plateau Pressure:  [26 cmH20-31 cmH20] 31 cmH20 CXR:  11/21- rt side infiltrate worsen, ett wnl ETT:  11/17>>>  A:  Respiratory failure secondary to aspiration resulting in cardiac arrest. Asp event P:   This is a unilateral process, ards could be dc'ed, TV to 500 Still keep platy less 30  ABg reviewed, increase peep, fio2 Rate maxed, treat k, see renal pcxr in am Consider comfort, will discuss with family abg to follow  CARDIOVASCULAR  Lab 02/04/12 0940 02/04/12 0350 02/03/12 2023 02/01/2012 1820 02/05/2012 1812  TROPONINI 0.54* 0.58* 0.63* 0.35* --  LATICACIDVEN -- -- -- 1.7 1.8  PROBNP -- -- -- 2816.0* --   ECG:  11/17>>>sinus brady with PVCs Lines: R radial a-line 11/17>>>  L [place] TLC 11/17>>>  A: Septic Shock -resolved P:  No pressors Mild brady, asymptomatic NCB noted Pressors wold be medically ineffective Treat k  RENAL  Lab 02/06/12 0420 02/05/12 0440 02/04/12 0350 02/03/12 2024 02/03/12 0231 02/11/2012 1820  NA 137 135 136 134* 130* --  K 5.9* 3.8 -- -- -- --  CL 107 106 105 105 100 --  CO2 15* 17* 18* 17* 18* --  BUN 47* 42* 34* 31* 27* --  CREATININE 4.67* 4.42* 3.51* 3.23* 2.64* --  CALCIUM 7.0* 7.1* 7.2* 7.0* 7.1* --  MG 2.2 2.1 2.2 -- 2.0 2.0  PHOS 5.8* 4.6 3.9 -- 2.8 2.1*   Intake/Output      11/20 0701 - 11/21 0700 11/21 0701 - 11/22 0700   I.V. (mL/kg) 996.7 (7.9) 40 (0.3)   NG/GT  30   IV Piggyback 827.5 12.5   Total Intake(mL/kg) 1824.2 (14.5) 82.5 (0.7)   Urine (mL/kg/hr) 625 (0.2) 300   Drains 10    Total Output 635 300   Net +1189.2  -217.5         Foley:  In place from [facility].  A:  Acute renal failure presumed, baseline unknown, mild hyper K, NON AG  P:   Add bicarb for K , family awaited kayxlate k repeat Avoid and dc saline Keep high MV to correct ph, increase TV bmet at 1800  GASTROINTESTINAL  Lab 02/03/12 2024 01/28/2012 1820  AST 50* 49*  ALT 51 63*  ALKPHOS 163* 202*  BILITOT 0.9 1.6*  PROT 5.4* 4.8*  ALBUMIN 1.6* 1.7*    A:  Distended post a gangrenous galbladder. Post-operative Ileus Protein malnutrition - nutrition recommends initiation of TPN  P:   NG to LIWS NPO  HEMATOLOGIC  Lab 02/06/12 0420 02/05/12 0440 02/04/12 0350 02/03/12 2024 02/03/12 0231 02/07/2012 1820  HGB 8.1* 7.8* 8.2* 8.3* 8.6* --  HCT 23.2* 22.1* 23.9* 23.4* 25.2* --  PLT 205 187 231 170 247 --  INR -- -- -- -- -- 1.17  APTT -- -- -- -- -- 28   A:  Leukocytosis likely from sepsis. Anemia P:  Monitor CBC. Ttransfusion for HGB less than 7. scd Limit phelebtoomy  INFECTIOUS  Lab 02/06/12 0420 02/05/12 0440 02/04/12 0350 02/03/12 2024 02/03/12 0231  WBC 16.1* 17.6* 24.8* 23.9* 26.3*  PROCALCITON -- -- -- -- --   Cultures: Blood 11/17>>> Sputum 11/17>>>KLEBSIELLA OXYTOCA, sensitivities noted.  Antibiotics: Vanc 11/17>>> Zosyn 11/17>>> Diflucan11/17>>>  A:  Septic Shock - Abdominal source and aspiration PNA. P:   Abx as above. CT of ABD will not change outcome  ENDOCRINE  Lab 02/06/12 0740 02/06/12 0343 02/05/12 2343 02/05/12 2104 02/05/12 1623  GLUCAP 110* 101* 94 101* 92   A:  Diabetes history.   P:   ISS CBG controlled  NEUROLOGIC  A:  Acute encephalopathy -  Likely severe anoxic injury however, CT of head 11/18 shows nonspecific white matter changes and no intraparenchymal hemorrhage. EEG shows signs consistent with anoxic injury. brursts suppression  P:   Neuro following, feels prognosis is grim. Continue Keppra. Continue Depakote. Dc sedation Treat myoclonus with benzo Will discuss  comfort with family  BEST PRACTICE / DISPOSITION Level of Care:  ICU Primary Service:  PCCM Consultants:  Neuro Code Status:  Full Diet:  NPO DVT Px:  SCDs GI Px:  Protonix. Skin Integrity:  Intact. Social / Family: Updated   CC time 30 min.  Mcarthur Rossetti. Tyson Alias, MD, FACP Pgr: [phone] [facility]

## 2012-02-07 LAB — GLUCOSE, CAPILLARY
Glucose-Capillary: 111 mg/dL — ABNORMAL HIGH (ref 70–99)
Glucose-Capillary: 96 mg/dL (ref 70–99)
Glucose-Capillary: 111 mg/dL — ABNORMAL HIGH (ref 70–99)
Glucose-Capillary: 120 mg/dL — ABNORMAL HIGH (ref 70–99)

## 2012-02-07 LAB — POCT I-STAT 3, ART BLOOD GAS (G3+)
O2 Saturation: 93 %
Patient temperature: 98.6
Patient temperature: 98.6
TCO2: 18 mmol/L (ref 0–100)
pCO2 arterial: 26.5 mmHg — ABNORMAL LOW (ref 35.0–45.0)
pCO2 arterial: 27.7 mmHg — ABNORMAL LOW (ref 35.0–45.0)
pH, Arterial: 7.38 (ref 7.350–7.450)
pH, Arterial: 7.403 (ref 7.350–7.450)

## 2012-02-07 MED ORDER — LEVOFLOXACIN IN D5W 500 MG/100ML IV SOLN
500.0000 mg | INTRAVENOUS | Status: DC
  Administered 2012-02-08: 500 mg via INTRAVENOUS
  Filled 2012-02-07: qty 100

## 2012-02-07 MED ORDER — SODIUM BICARBONATE 8.4 % IV SOLN
INTRAVENOUS | Status: DC
  Administered 2012-02-07 – 2012-02-08 (×3): via INTRAVENOUS
  Filled 2012-02-07 (×6): qty 150

## 2012-02-07 MED ORDER — PIPERACILLIN-TAZOBACTAM IN DEX 2-0.25 GM/50ML IV SOLN
2.2500 g | Freq: Three times a day (TID) | INTRAVENOUS | Status: DC
  Administered 2012-02-07 – 2012-02-08 (×4): 2.25 g via INTRAVENOUS
  Filled 2012-02-07 (×6): qty 50

## 2012-02-07 MED ORDER — SODIUM CHLORIDE 0.9 % IV SOLN
INTRAVENOUS | Status: DC
  Administered 2012-02-07: 07:00:00 via INTRAVENOUS

## 2012-02-07 NOTE — Procedures (Signed)
EEG NUMBER:  REFERRING PHYSICIAN:  Dr. Molli Knock.  HISTORY: 76 yo male unresponsive s/p cardiac arrest.  MEDICATIONS:  Diflucan, levetiracetam, Protonix, Zosyn, valproate, Vancomycin, Propofol  CONDITIONS OF RECORDING:  This is a 16-channel EEG carried out with the patient in the unresponsive state.  EEG was continuous and recorded from approximately 8:30 a.m. on the 20th to approximately 8:30 a.m. on the 21st.  DESCRIPTION:  The background activity consists of attenuated activity. Intermittently, during the tracing was a burst of a sharp transient. These sharp transients occurred at intervals alternating with attenuated background activity that would last from 1-2 seconds up to a maximum of approximately 30 seconds. This alternating activity was persistent during the tracing despite stimulation.  During the recording, sedation was discontinued.  Despite discontinuation of sedation, the background activity remained unchanged.  IMPRESSION:  This is a markedly abnormal EEG secondary to attenuated activity and intermittent generalized sharp activity.  No normal background activity was noted.          ______________________________ Thana Farr, MD    JX:[id number] D:  02/06/2012 18:15:58  T:  02/07/2012 06:36:38  Job #:  [id number]

## 2012-02-07 NOTE — Progress Notes (Addendum)
Name: Jeremiah Rivera MRN: [id number] DOB: October 09, 1935    LOS: 5  Referring Provider:  [facility] surgery. Reason for Referral:  Cardiac arrest and respiratory failure  PULMONARY / CRITICAL CARE MEDICINE  HPI:  76 year old presented to [facility] with a gangrenous galbladder that was removed followed by ERCP.  The patient was admitted to the floor where he had a presumed aspiration event and cardiac arrest.  Code x12 minutes with subsequent myoclonus.  At the family's request patient was transferred to [facility].  Past Medical History  Diagnosis Date  . Hypertension   . Diabetes mellitus without complication   . Hypercholesterolemia    Past Surgical History  Procedure Date  . Cholecystectomy    Review Of Systems:  Unattainable  Events Since Admission: 11/17 Cardiac arrest and transfer to [facility] 11/18 CT of Head, EEG showing severe hypoxic encephalopathy 11/20- poor neuro recovery, NCB established, worsening k, hypoxia 11/20 to 11/21: 24 hour EEG.  Attenuated activity with intermittent generalized sharp burst.  11/21- hypoxia, peep increased, family meeting 11/22- propofol for myoclonus  Current Status: Critical  Vital Signs: Temp:  [97.4 F (36.3 C)-98 F (36.7 C)] 98 F (36.7 C) (11/22 0000) Pulse Rate:  [44-60] 46  (11/22 0600) Resp:  [0-32] 30  (11/22 0600) BP: (106-165)/(50-76) 131/65 mmHg (11/22 0600) SpO2:  [89 %-100 %] 99 % (11/22 0600) FiO2 (%):  [50 %-60 %] 50 % (11/22 0432) Weight:  [279 lb 12.2 oz (126.9 kg)] 279 lb 12.2 oz (126.9 kg) (11/22 0400) CVP:  [15 mmHg-19 mmHg] 15 mmHg  Physical Examination: General:  Obese male with distended abdomen. Neuro:  No corneal, no gag, no cough, pupils minimally reactive.  HEENT:  Ett, jvd wnl Neck:  Supple, ett Cardiovascular:  RRR, Nl S1/S2, -M pulses +2 Lungs:  Minimal course breath sounds bilaterally.  Abdomen:  Distended, hypertympanic, -BS. Musculoskeletal:  -tenderness. Skin:  Intact, diffuse edema in all extremities.  Penile  edema.   Active Problems:  Acute respiratory failure  Metabolic acidosis  Anoxic brain injury  Altered mental status  Cardiac arrest  Myoclonus  Renal failure  Anoxic brain damage   ASSESSMENT AND PLAN  PULMONARY  Lab 02/07/12 0446 02/06/12 1034 02/06/12 0340 02/05/12 1023 02/05/12 0645  PHART 7.380 7.347* 7.284* 7.326* 7.308*  PCO2ART 26.5* 27.7* 33.1* 31.1* 34.3*  PO2ART 76.0* 59.3* 54.0* 77.0* 92.9  HCO3 15.7* 15.0* 15.7* 16.3* 16.7*  O2SAT 95.0 89.3 84.0 94.0 98.2   Ventilator Settings: Vent Mode:  [-] PRVC FiO2 (%):  [50 %-60 %] 50 % Set Rate:  [30 bmp] 30 bmp Vt Set:  [500 mL] 500 mL PEEP:  [8 cmH20-10 cmH20] 8 cmH20 Plateau Pressure:  [23 cmH20-33 cmH20] 27 cmH20 CXR:  11/21- rt side infiltrate worsen, ett wnl ETT:  11/17>>>  A:  Respiratory failure secondary to aspiration resulting in cardiac arrest. Asp event P:   abg reviewed, Improved with TV increase Still keep platy less 30  ABg reviewed no changes for today.  Rate maxed k improved as well after ph correction Consider comfort, will discuss with family again today  CARDIOVASCULAR  Lab 02/04/12 0940 02/04/12 0350 02/03/12 2023 01/22/2012 1820 01/20/2012 1812  TROPONINI 0.54* 0.58* 0.63* 0.35* --  LATICACIDVEN -- -- -- 1.7 1.8  PROBNP -- -- -- 2816.0* --   ECG:  11/17>>>sinus brady with PVCs, worsening  Lines: R radial a-line 11/17>>>  L [place] TLC 11/17>>>  A: Septic Shock -resolved P:  No pressors Mild brady, asymptomatic likely related to  propofol  DNR noted, family would want pressors and escalation still  See global comments  RENAL  Lab 02/06/12 1741 02/06/12 0420 02/05/12 0440 02/04/12 0350 02/03/12 2024 02/03/12 0231 02/01/2012 1820  NA 139 137 135 136 134* -- --  K 4.0 5.9* -- -- -- -- --  CL 107 107 106 105 105 -- --  CO2 15* 15* 17* 18* 17* -- --  BUN 49* 47* 42* 34* 31* -- --  CREATININE 4.87* 4.67* 4.42* 3.51* 3.23* -- --  CALCIUM 7.4* 7.0* 7.1* 7.2* 7.0* -- --  MG -- 2.2 2.1 2.2 --  2.0 2.0  PHOS -- 5.8* 4.6 3.9 -- 2.8 2.1*   Intake/Output      11/21 0701 - 11/22 0700 11/22 0701 - 11/23 0700   I.V. (mL/kg) 1365.4 (10.8)    NG/GT 60    IV Piggyback 917.5    Total Intake(mL/kg) 2342.9 (18.5)    Urine (mL/kg/hr) 1540 (0.5)    Drains     Stool 2    Total Output 1542    Net +800.9          Foley:  In [facility] from [facility].  A:  Acute renal failure presumed, baseline unknown, NON AG, K better, Bicarb trending downward even on drip.  P:   family awaited Consider change to D5 with 150 mEq of Bicarb to further help with acidosis Avoid and dc saline Keep high MV to correct ph, increase TV  GASTROINTESTINAL  Lab 02/03/12 2024 02/14/2012 1820  AST 50* 49*  ALT 51 63*  ALKPHOS 163* 202*  BILITOT 0.9 1.6*  PROT 5.4* 4.8*  ALBUMIN 1.6* 1.7*   A:  Distended post a gangrenous galbladder. Post-operative Ileus Protein malnutrition - nutrition recommends initiation of TPN  P:   NG to LIWS NPO TPN would be medically ineffective  HEMATOLOGIC  Lab 02/06/12 0420 02/05/12 0440 02/04/12 0350 02/03/12 2024 02/03/12 0231 02/13/2012 1820  HGB 8.1* 7.8* 8.2* 8.3* 8.6* --  HCT 23.2* 22.1* 23.9* 23.4* 25.2* --  PLT 205 187 231 170 247 --  INR -- -- -- -- -- 1.17  APTT -- -- -- -- -- 28   A:  Leukocytosis likely from sepsis. Anemia P:  Monitor CBC. Ttransfusion for HGB less than 7. scd Limit phelebtoomy  INFECTIOUS  Lab 02/06/12 0420 02/05/12 0440 02/04/12 0350 02/03/12 2024 02/03/12 0231  WBC 16.1* 17.6* 24.8* 23.9* 26.3*  PROCALCITON -- -- -- -- --   Cultures: Blood 11/17>>> Sputum 11/17>>>KLEBSIELLA OXYTOCA, sensitivities noted.  Antibiotics: Vanc 11/17>>>11/21 Zosyn 11/17>>>11/21 Diflucan11/17>>>11/21  A:  Septic Shock - Abdominal source and aspiration PNA. P:   Abx as above. CT of ABD will not change outcome Add stop dates abx after further family meetings No mrsa/ enteroccus, dc vanc Dc diflucan Maintain zosyn  ENDOCRINE  Lab 02/07/12 0358  02/06/12 2346 02/06/12 1956 02/06/12 1538 02/06/12 1149  GLUCAP 105* 111* 113* 115* 108*   A:  Diabetes history.   P:   ISS CBG controlled  NEUROLOGIC  A:  Acute encephalopathy -  Likely severe anoxic injury however, CT of head 11/18 shows nonspecific white matter changes and no intraparenchymal hemorrhage. EEG shows signs consistent with anoxic injury. brursts suppression  P:   Neuro following, feels prognosis is grim. Continue Keppra. Continue Depakote. Dc sedation now over 2 days Treat myoclonus with propofol Will discuss comfort with family  BEST PRACTICE / DISPOSITION Level of Care:  ICU Primary Service:  PCCM Consultants:  Neuro Code Status:  Full Diet:  NPO DVT Px:  SCDs GI Px:  Protonix. Skin Integrity:  Intact. Social / Family: Updated    Global: Poor prognosis, cpr, hd, tpn  And any escalation care is medically ineffective. Await further fam meetings  PRIBULA,CHRISTOPHER, MD Internal Medicine Resident, PGY III Pager: [phone] 02/07/2012 8:34 AM   Ccm time personal 30 min  Mcarthur Rossetti. Tyson Alias, MD, FACP Pgr: [phone] [place] [facility]

## 2012-02-07 NOTE — Procedures (Signed)
EEG NUMBER:  REFERRING PHYSICIAN:  Dr. Molli Knock.  HISTORY:  A 76 year old male with unresponsiveness, status post cardiac Arrest.  MEDICATIONS:  Diflucan, levetiracetam, Protonix, Zosyn, valproate, Vancomycin.  CONDITIONS OF RECORDING:  This is a 16-channel EEG carried out with the patient in the unresponsive state. EEG was recorded from approximately 8:30 a.m. to approximately 9:48 a.m.    DESCRIPTION: The background activity is discontinuous.  It consist of sharp transients that is followed by attenuated activity.  The sharp transients are generalized. The attenuated activity is seen over both hemispheres.  The periods of attenuation last from 1-2 seconds up to 30 seconds.     IMPRESSION:  This is a markedly abnormal EEG secondary to sharp transients alternating with attenuated background activity.  No normal background activity is noted.          ______________________________ Thana Farr, MD    NW:GNFA D:  02/06/2012 18:18:27  T:  02/07/2012 08:32:44  Job #:  [id number]

## 2012-02-07 NOTE — Progress Notes (Signed)
ANTIBIOTIC CONSULT NOTE - INITIAL  Pharmacy Consult for Zosyn Indication: Septic shock from abdominal source and asp. PNA  Allergies  Allergen Reactions  . Ace Inhibitors Swelling    Patient Measurements: Height: 6\' 3"  (190.5 cm) Weight: 279 lb 12.2 oz (126.9 kg) IBW/kg (Calculated) : 84.5   Vital Signs: Temp: 98 F (36.7 C) (11/22 0000) Temp src: Oral (11/22 0000) BP: 113/56 mmHg (11/22 0853) Pulse Rate: 50  (11/22 0853) Intake/Output from previous day: 11/21 0701 - 11/22 0700 In: 2342.9 [I.V.:1365.4; NG/GT:60; IV Piggyback:917.5] Out: 1542 [Urine:1540; Stool:2] Intake/Output from this shift:    Labs:  Basename 02/06/12 1741 02/06/12 0420 02/05/12 0440  WBC -- 16.1* 17.6*  HGB -- 8.1* 7.8*  PLT -- 205 187  LABCREA -- -- --  CREATININE 4.87* 4.67* 4.42*   Estimated Creatinine Clearance: 19.1 ml/min (by C-G formula based on Cr of 4.87). No results found for this basename: VANCOTROUGH:2,VANCOPEAK:2,VANCORANDOM:2,GENTTROUGH:2,GENTPEAK:2,GENTRANDOM:2,TOBRATROUGH:2,TOBRAPEAK:2,TOBRARND:2,AMIKACINPEAK:2,AMIKACINTROU:2,AMIKACIN:2, in the last 72 hours   Assessment: 76 y/o male patient transferred from [facility] s/p aspiration and cardiac arrest event requiring broad spectrum antibiotics for possible asp. pna vs intra-abdominal infection. Pt is currently on levaquin and flagyl, will add back Zosyn. Pt. With ARF, est. crcl ~ 19 ml/min.   Vanc 11/17>>11/21 Zosyn 11/17>>11/21, 11/22 Diflucan 11/17>>11/22 Levaquin 11/21>> Flagyl 11/21>>  Bld x 2 11/17>>pending Sputum 11/17>>klebsiella oxytoca (sens to cefepime, cefoxitine, ceftaz, ceftriax, cipro, gent, imipenem, tobra, bactrim)  Plan:  - Zosyn 2.25 g IV Q 8 hrs - f/u renal function, and clinical improvement. - Change levaquin to 500mg  IV Q 48 hrs (renal dose adjustment)  Bayard Hugger, PharmD, BCPS  Clinical Pharmacist  Pager: [phone]

## 2012-02-08 LAB — GLUCOSE, CAPILLARY: Glucose-Capillary: 123 mg/dL — ABNORMAL HIGH (ref 70–99)

## 2012-02-08 MED ORDER — MIDAZOLAM BOLUS VIA INFUSION
5.0000 mg | INTRAVENOUS | Status: DC | PRN
  Filled 2012-02-08: qty 20

## 2012-02-08 MED ORDER — HYDRALAZINE HCL 20 MG/ML IJ SOLN
20.0000 mg | Freq: Three times a day (TID) | INTRAMUSCULAR | Status: DC | PRN
  Administered 2012-02-08: 20 mg via INTRAVENOUS
  Filled 2012-02-08: qty 1

## 2012-02-08 MED ORDER — SODIUM CHLORIDE 0.9 % IV SOLN
10.0000 mg/h | INTRAVENOUS | Status: DC
  Administered 2012-02-08: 10 mg/h via INTRAVENOUS
  Filled 2012-02-08: qty 10

## 2012-02-08 MED ORDER — MORPHINE SULFATE 25 MG/ML IV SOLN
10.0000 mg/h | INTRAVENOUS | Status: DC
  Administered 2012-02-08: 10 mg/h via INTRAVENOUS
  Filled 2012-02-08: qty 10

## 2012-02-08 MED ORDER — MORPHINE BOLUS VIA INFUSION
5.0000 mg | INTRAVENOUS | Status: DC | PRN
  Filled 2012-02-08: qty 20

## 2012-02-09 LAB — CULTURE, BLOOD (ROUTINE X 2)
Culture: NO GROWTH
Culture: NO GROWTH

## 2012-02-16 NOTE — Procedures (Signed)
Extubation Procedure Note  Patient Details:   Name: Jeremiah Rivera DOB: 12-01-1935 MRN: [id number]   Pt terminally extubated per MD order, no distress, appears comfortable, RT to monitor    Evaluation  O2 sats: transiently fell during during procedure Complications: No apparent complications Patient did tolerate procedure well. Bilateral Breath Sounds: Rhonchi Suctioning: Airway No  Harley Hallmark 01/18/2012, 5:16 PM

## 2012-02-16 NOTE — Progress Notes (Signed)
Name: Jeremiah Rivera MRN: [id number] DOB: [birth year]    LOS: 6  Referring Provider:  [facility] surgery. Reason for Referral:  Cardiac arrest and respiratory failure  PULMONARY / CRITICAL CARE MEDICINE  HPI:  76 year old presented to [facility] with a gangrenous galbladder that was removed followed by ERCP.  The patient was admitted to the floor where he had a presumed aspiration event and cardiac arrest.  Code x12 minutes with subsequent myoclonus.  At the family's request patient was transferred to [facility] on 11/17.   Events Since Admission: 11/17 Cardiac arrest and transfer to [facility] 11/18 CT of Head, EEG showing severe hypoxic encephalopathy 11/20- poor neuro recovery, NCB established, worsening k, hypoxia 11/20 to 11/21: 24 hour EEG.  Attenuated activity with intermittent generalized sharp burst.  11/21- hypoxia, peep increased, family meeting 11/22- propofol for myoclonus  Current Status: Critical  Vital Signs: Temp:  [97.4 F (36.3 C)-98.9 F (37.2 C)] 98 F (36.7 C) (11/23 0802) Pulse Rate:  [49-68] 68  (11/23 0834) Resp:  [20-32] 20  (11/23 0834) BP: (94-178)/(56-79) 94/79 mmHg (11/23 0834) SpO2:  [91 %-98 %] 97 % (11/23 0834) FiO2 (%):  [50 %] 50 % (11/23 0834) CVP:  [11 mmHg] 11 mmHg  Physical Examination: General:  Obese male with distended abdomen. Neuro:  No corneal, no gag, no cough, pupils minimally reactive.  HEENT:  Ett, jvd wnl Neck:  Supple, ett Cardiovascular:  RRR, Nl S1/S2, -M pulses +2 Lungs:  Minimal course breath sounds bilaterally.  Abdomen:  Distended, hypertympanic, -BS. Musculoskeletal:  -tenderness. Skin:  Intact, diffuse edema in all extremities.  Penile edema.   Active Problems:  Acute respiratory failure  Metabolic acidosis  Anoxic brain injury  Altered mental status  Cardiac arrest  Myoclonus  Renal failure  Anoxic brain damage   ASSESSMENT AND PLAN  PULMONARY  Lab 02/07/12 2001 02/07/12 0446 02/06/12 1034 02/06/12 0340 02/05/12 1023    PHART 7.403 7.380 7.347* 7.284* 7.326*  PCO2ART 27.7* 26.5* 27.7* 33.1* 31.1*  PO2ART 66.0* 76.0* 59.3* 54.0* 77.0*  HCO3 17.3* 15.7* 15.0* 15.7* 16.3*  O2SAT 93.0 95.0 89.3 84.0 94.0   Ventilator Settings: Vent Mode:  [-] PRVC FiO2 (%):  [50 %] 50 % Set Rate:  [20 bmp-30 bmp] 20 bmp Vt Set:  [500 mL] 500 mL PEEP:  [8 cmH20] 8 cmH20 Plateau Pressure:  [19 cmH20-29 cmH20] 29 cmH20 CXR:  11/21- rt side infiltrate worsen, ett wnl ETT:  11/17>>>  A:  Respiratory failure secondary to aspiration resulting in cardiac arrest. Asp event P:   Await family decision on vent withdrawal SBTs ok once pEEP down to 5  CARDIOVASCULAR  Lab 02/04/12 0940 02/04/12 0350 02/03/12 2023 02-21-2012 1820 02-21-12 1812  TROPONINI 0.54* 0.58* 0.63* 0.35* --  LATICACIDVEN -- -- -- 1.7 1.8  PROBNP -- -- -- 2816.0* --   ECG:  11/17>>>sinus brady with PVCs, worsening  Lines: R radial a-line 11/17>>>  L [place] TLC 11/17>>>  A: Septic Shock -resolved P:  No pressors Mild brady, asymptomatic likely related to propofol  DNR noted, family would want pressors and escalation still    RENAL  Lab 02/06/12 1741 02/06/12 0420 02/05/12 0440 02/04/12 0350 02/03/12 2024 02/03/12 0231 02-21-2012 1820  NA 139 137 135 136 134* -- --  K 4.0 5.9* -- -- -- -- --  CL 107 107 106 105 105 -- --  CO2 15* 15* 17* 18* 17* -- --  BUN 49* 47* 42* 34* 31* -- --  CREATININE 4.87*  4.67* 4.42* 3.51* 3.23* -- --  CALCIUM 7.4* 7.0* 7.1* 7.2* 7.0* -- --  MG -- 2.2 2.1 2.2 -- 2.0 2.0  PHOS -- 5.8* 4.6 3.9 -- 2.8 2.1*   Intake/Output      11/22 0701 - 11/23 0700 11/23 0701 - 11/24 0700   I.V. (mL/kg) 2284.6 (18) 92.6 (0.7)   NG/GT 30    IV Piggyback 495    Total Intake(mL/kg) 2809.6 (22.1) 92.6 (0.7)   Urine (mL/kg/hr) 1605 (0.5) 120   Stool     Total Output 1605 120   Net +1204.6 -27.4         Foley:  In place from [facility].  A:  Acute renal failure presumed, baseline unknown, NON AG, K better, Bicarb trending downward even on  drip.  P:   ct D5 with 150 mEq of Bicarb Avoid and dc saline   GASTROINTESTINAL  Lab 02/03/12 2024 02/05/2012 1820  AST 50* 49*  ALT 51 63*  ALKPHOS 163* 202*  BILITOT 0.9 1.6*  PROT 5.4* 4.8*  ALBUMIN 1.6* 1.7*   A:  Distended post a gangrenous galbladder. Post-operative Ileus Protein malnutrition - nutrition recommends initiation of TPN  P:   NG to LIWS NPO TPN would be medically ineffective  HEMATOLOGIC  Lab 02/06/12 0420 02/05/12 0440 02/04/12 0350 02/03/12 2024 02/03/12 0231 02/13/2012 1820  HGB 8.1* 7.8* 8.2* 8.3* 8.6* --  HCT 23.2* 22.1* 23.9* 23.4* 25.2* --  PLT 205 187 231 170 247 --  INR -- -- -- -- -- 1.17  APTT -- -- -- -- -- 28   A:  Leukocytosis likely from sepsis. Anemia P:  Monitor CBC. Ttransfusion for HGB less than 7. scd Limit phelebtoomy  INFECTIOUS  Lab 02/06/12 0420 02/05/12 0440 02/04/12 0350 02/03/12 2024 02/03/12 0231  WBC 16.1* 17.6* 24.8* 23.9* 26.3*  PROCALCITON -- -- -- -- --   Cultures: Blood 11/17>>>ng Sputum 11/17>>>KLEBSIELLA OXYTOCA S to ceftx, cipro  Antibiotics: Vanc 11/17>>>11/21 Zosyn 11/17>>>11/21 Diflucan11/17>>>11/21 levaquin 11/22 >>  A:  Septic Shock - Abdominal source and aspiration PNA. P:   Abx as above. CT of ABD will not change outcome Add stop dates abx after further family meetings   ENDOCRINE  Lab 01/18/2012 0850 01/31/2012 0403 01/30/2012 0025 02/07/12 1943 02/07/12 1630  GLUCAP 130* 125* 129* 120* 112*   A:  Diabetes history.   P:   ISS CBG controlled  NEUROLOGIC  A:  Acute encephalopathy -  Likely severe anoxic injury however, CT of head 11/18 shows nonspecific white matter changes and no intraparenchymal hemorrhage. EEG shows signs consistent with anoxic injury. brursts suppression EEG 11/22 abnormal EEG secondary to sharp  transients alternating with attenuated background activity  P:   Neuro following, feels prognosis is grim. Continue Keppra. Continue Depakote. Dc sedation  Treat  myoclonus with propofol   BEST PRACTICE / DISPOSITION Level of Care:  ICU Primary Service:  PCCM Consultants:  Neuro Code Status:  Full Diet:  NPO DVT Px:  SCDs GI Px:  Protonix. Skin Integrity:  Intact. Social / Family: Updated    Global: Poor prognosis, cpr, hd, tpn  And any escalation care is medically ineffective. Await further fam meetings - cousin brother at bedside updated   Care during the described time interval was provided by me and/or other providers on the critical care team.  I have reviewed this patient's available data, including medical history, events of note, physical examination and test results as part of my evaluation  CC time x 59m  Oretha Milch., MD  Feb 17, 2012 9:14 AM

## 2012-02-16 NOTE — Progress Notes (Signed)
Patient deceased with family at bedside. Two RN's present to witness death. There was an absence of vital signs, respirations, and heart beat for one full minute via auscultation and palpation. Family remains at bedside with visitors. Second RN witness was Bernette Mayers RN. Provided emotional support to those present and provided them time alone to grieve with patient.

## 2012-02-16 NOTE — Progress Notes (Signed)
eLink Physician-Brief Progress Note Patient Name: Jeremiah Rivera DOB: Aug 09, 1935 MRN: [id number]  Date of Service  02/10/2012   HPI/Events of Note  Family requesting "Comfort Measures Only" status.   eICU Interventions  Discussed with son (medical POA).  Questions answered.  RN confirmed.  Dr.  Reginia Naas last note reviewed.  Orders placed.   Intervention Category Major Interventions: OtherLonia Farber 01/30/2012, 4:26 PM

## 2012-02-16 NOTE — Progress Notes (Signed)
Chaplain visited patient's family at bedside when patient was extubated to provide support as requested by the nurse.  Family stated they had a minister in the family and were doing okay.  Chaplain Rutherford Nail

## 2012-02-16 NOTE — Progress Notes (Signed)
75 ml of Morphine drip, 30 ml of propofol, 10 ml versed all wasted down sink with second RN witness.

## 2012-02-16 DEATH — deceased

## 2012-02-25 NOTE — Discharge Summary (Signed)
NAMEMarland Kitchen  TERRIL, CHESTNUT NO.:  [id number]  MEDICAL RECORD NO.:  [id number]  LOCATION:  2912                         FACILITY:  [facility]  PHYSICIAN:  Nelda Bucks, MD DATE OF BIRTH:  1935-12-12  DATE OF ADMISSION:  01/24/2012 DATE OF DISCHARGE:  02-22-12                              DISCHARGE SUMMARY   This is a 76 year old, who presented to the [facility] with dry gangrenous gallbladder that was removed following ERCP. The patient was admitted to the floor where he had presumed aspiration event and suffered a cardiac arrest.  The patient had coded for approximately 12 minutes and suffered significant myoclonus.  The patient at family's request was transferred to the [facility].  Underlying past medical history included hypertension, diabetes, hypercholesterolemia, and cholecystectomy which I noted above. Hospital course included on November 17, having cardiac arrest and transferred to [facility].  The cardiac arrest is listed as 12 minutes but his clinical status may be was consistent with longer down time.  On November 18, CT scan of the head was performed.  EEG showed severe hypoxic encephalopathy.  CT scan of the head as well consistent with anoxic brain injury.  On November 20, the patient has poor neurologic recovery.  No code blue was established with worsening hyperkalemia, hypoxia.  On November 20 to November 21, there was a 24 hour EEG. Continuous EEG performed with generalized sharp bursts.  On November 21, the patient had worsening hypoxia. PEEP was increased.  Family meetings were held.  Neurology was consulting and gave poor prognosis for functional recovery.  The patient's myoclonus was being treated with propofol.  On November 23 after further discussions, family decided for comfort care based on his likely poor neurologic outcome.  Comfort care was provided and the patient expired.  FINAL DIAGNOSES UPON  DEATH: 1. Status post cardiac arrest with severe anoxic brain injury. 2. Likely myoclonus, rule out seizure activity, status post cardiac     arrest. 3. Acute respiratory failure. 4. Acute renal failure. 5. Status post gangrenous gallbladder with cholecystectomy and ERCP     performed.     Nelda Bucks, MD     DJF/MEDQ  D:  02/24/2012  T:  02/24/2012  Job:  [id number]

## 2013-03-21 IMAGING — CR DG CHEST 1V PORT
1 series · 1 of 1 positions shown · non-contrast
Comparison: Portable exam 3646 hours compared to 5045 hours

CLINICAL DATA: Respiratory distress

PORTABLE CHEST - 1 VIEW

[AP]
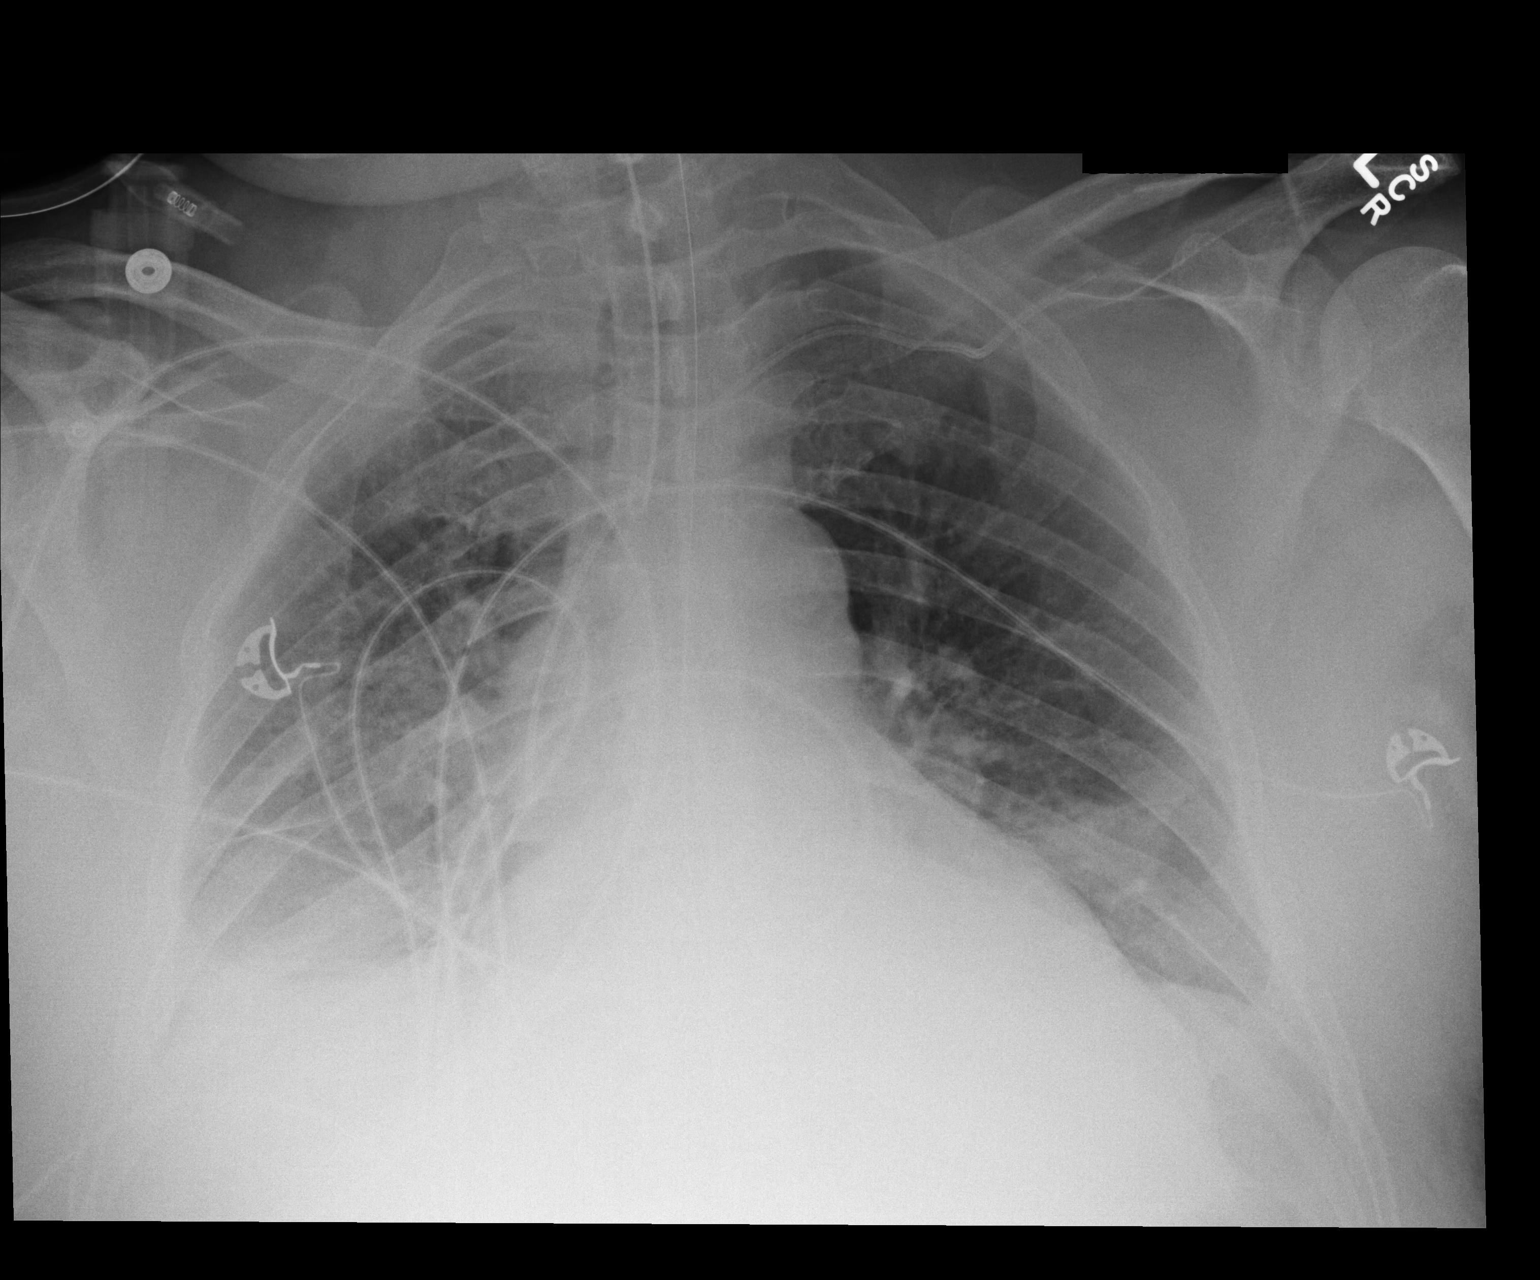

[1 of 1 positions shown; findings below may reference images not displayed]

FINDINGS: Tip of endotracheal tube 2.1 cm above carina.
Nasogastric tube extends into abdomen.
Left subclavian central venous catheter, tip projecting over SVC.
Enlargement of cardiac silhouette.
Bibasilar atelectasis and probable right pleural effusion.
Question perihilar infiltrate right lung.
No pneumothorax.
IMPRESSION: No interval change.
Bibasilar atelectasis and probable right pleural effusion.

## 2013-03-23 IMAGING — CR DG CHEST 1V PORT
1 series · 1 of 1 positions shown · non-contrast
Comparison: 02/04/2012.

CLINICAL DATA: Endotracheal tube.

PORTABLE CHEST - 1 VIEW

[AP]
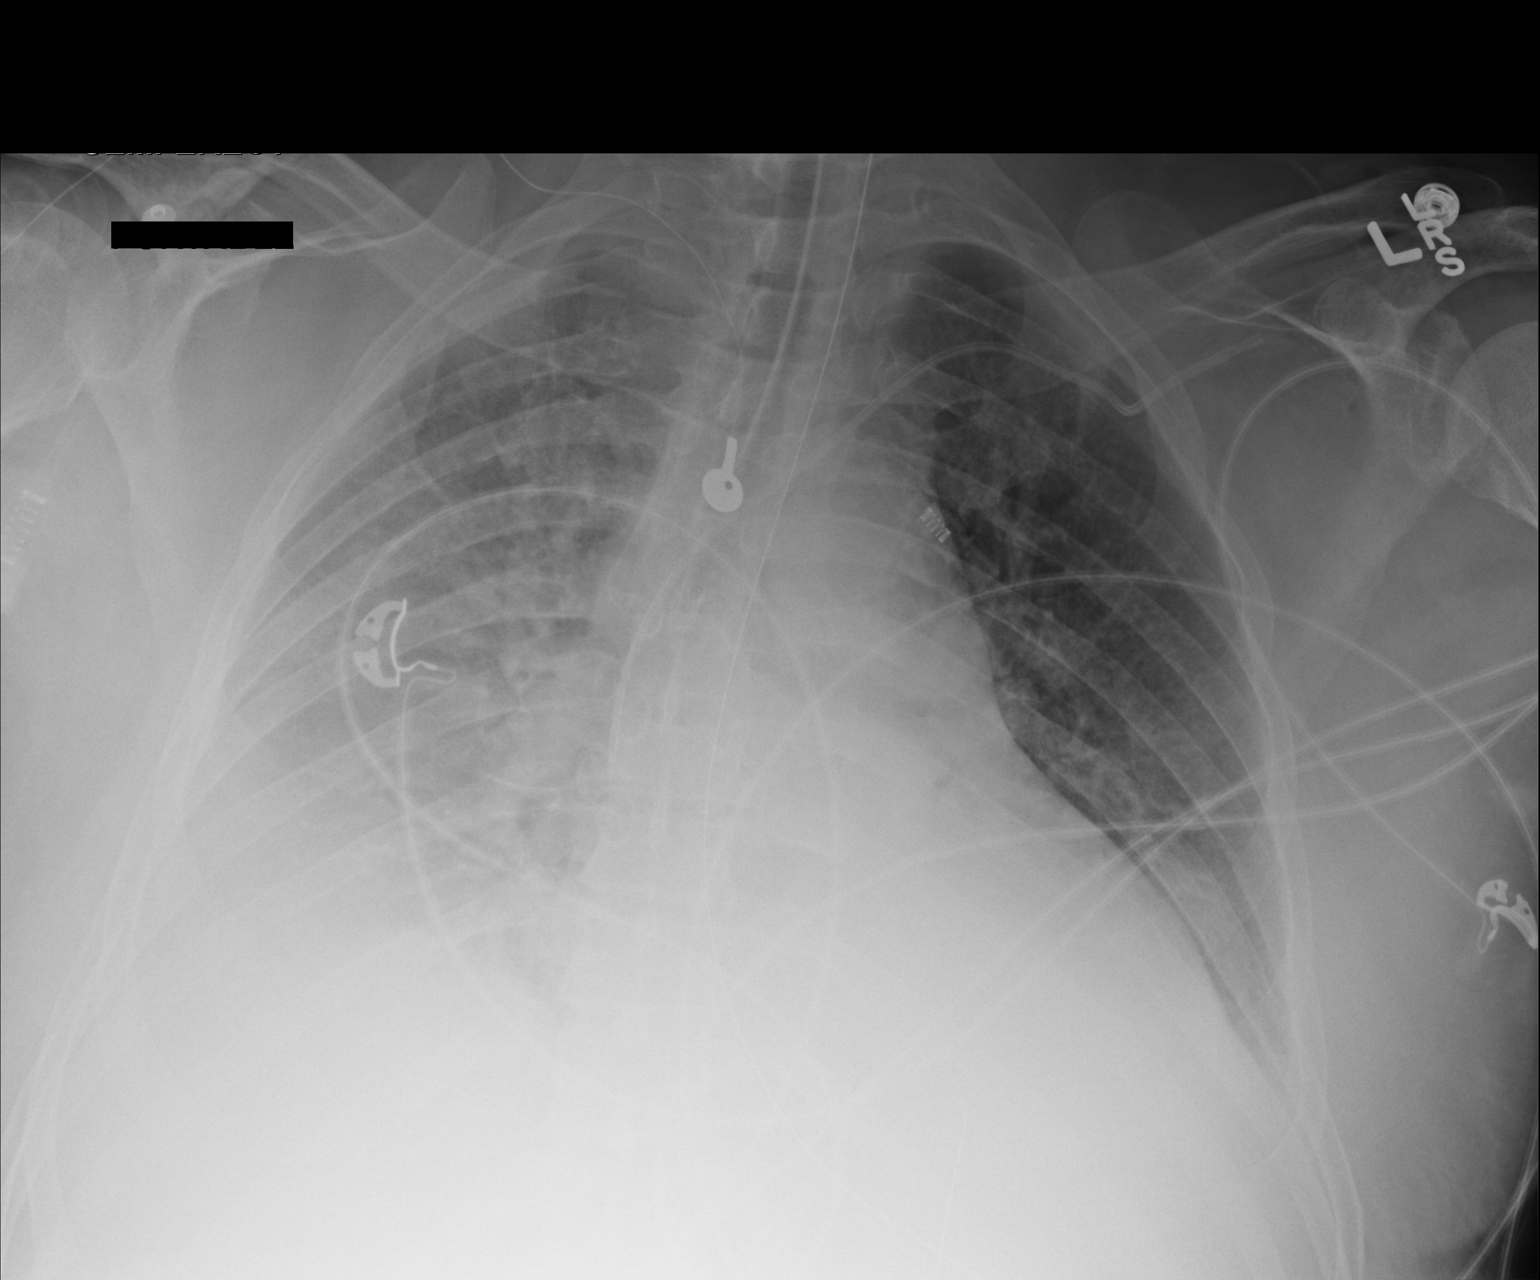

[1 of 1 positions shown; findings below may reference images not displayed]

FINDINGS: Endotracheal tube tip 3 cm above the carina.  Overlying
the lead slightly limits evaluation.

Left central line tip distal superior vena cava level.

No gross pneumothorax.

Asymmetric air space disease greater on the right.  This may
represent asymmetric pulmonary edema.  Infectious infiltrate not
excluded.

Evaluation of the lung bases is limited. Follow-up until clearance
recommended.

Heart size top normal.
IMPRESSION: ndotracheal tube tip 3 cm above the carina.  Overlying the lead
slightly limits evaluation.

Asymmetric air space disease greater on the right.  This may
represent asymmetric pulmonary edema.  Infectious infiltrate not
excluded.

## 2014-07-05 NOTE — Consult Note (Signed)
Chief Complaint:   Subjective/Chief Complaint Abdominal pain has improved with several laxative induced BMs. BP has improved but is still elevated. Home BP meds not started.   VITAL SIGNS/ANCILLARY NOTES: **Vital Signs.:   09-Nov-13 09:04   Vital Signs Type Q 4hr   Temperature Temperature (F) 99.9   Celsius 37.7   Temperature Source Oral   Pulse Pulse 94   Systolic BP Systolic BP 166   Diastolic BP (mmHg) Diastolic BP (mmHg) 84   Mean BP 111   Pulse Ox % Pulse Ox % 96   Pulse Ox Activity Level  At rest   Oxygen Delivery Room Air/ 21 %   Brief Assessment:   Cardiac Regular  no murmur    Respiratory normal resp effort    Gastrointestinal less distended but not tender    Gastrointestinal details normal Nontender   Assessment/Plan:  Assessment/Plan:   Assessment Hypertensive urgency Constipation Leukocytosis-normal ua, afebrile    Plan Resume exforge hct and carvedilol. Dc metoprolol Ct enemas per Gen Surg Send stool for cdiff. Blood cultures if spikes temp   Electronic Signatures: Charlesetta Garibaldiejan-Sie, Sheikh A (MD)  (Signed [phone]-Nov-13 11:16)  Authored: Chief Complaint, VITAL SIGNS/ANCILLARY NOTES, Brief Assessment, Assessment/Plan   Last Updated: 09-Nov-13 11:16 by Charlesetta Garibaldiejan-Sie, Sheikh A (MD)

## 2014-07-05 NOTE — Consult Note (Signed)
Chief Complaint:   Subjective/Chief Complaint No new complaints or interval acute events. Sodium slightly higher, Cr higher today.Nephrology consult noted.   VITAL SIGNS/ANCILLARY NOTES: **Vital Signs.:   12-Nov-13 09:09   Vital Signs Type Q 4hr   Temperature Temperature (F) 99.5   Celsius 37.5   Temperature Source Oral   Pulse Pulse 86   Respirations Respirations 20   Systolic BP Systolic BP 165   Diastolic BP (mmHg) Diastolic BP (mmHg) 88   Mean BP 113   Pulse Ox % Pulse Ox % 95   Pulse Ox Activity Level  At rest   Oxygen Delivery Room Air/ 21 %   Brief Assessment:   Cardiac Regular    Respiratory rhonchi    Gastrointestinal distended, bs hypoactive    Gastrointestinal details normal No rebound tenderness  No gaurding  No rigidity  No organomegaly   Assessment/Plan:  Assessment/Plan:   Assessment Hypertensive urgency Cholelithiasis Leukocytosis-normal ua, afebrile Hyponatremia Acute kidney injury    Plan Ct diovan, amlodipine and carvedilol. Hold hct  Possible surgery in AM  per surgery Hyponatremia- with elevated cr suggests combined intravascular salt and water depletion;Increase rate of NS Hypokalemia-supplement AKI-ATN, although FeUrea is <35, give fluids as above. Nephrology following Send stool for cdiff. Blood cultures if spikes temp   Electronic Signatures: Charlesetta Garibaldiejan-Sie, Sheikh A (MD)  (Signed [phone]-Nov-13 13:46)  Authored: Chief Complaint, VITAL SIGNS/ANCILLARY NOTES, Brief Assessment, Assessment/Plan   Last Updated: 12-Nov-13 13:46 by Charlesetta Garibaldiejan-Sie, Sheikh A (MD)

## 2014-07-05 NOTE — Consult Note (Signed)
PATIENT NAME:  Jeremiah Rivera, Jeremiah Rivera MR#:  [id number] DATE OF BIRTH:  1935-09-05  DATE OF CONSULTATION:  01/24/2012  REFERRING PHYSICIAN:  Natale LayMark Bird, MD  CONSULTING PHYSICIAN:  Everley Evora R. Balthazar Dooly, MD PRIMARY CARE PHYSICIAN: Bluford MainSheikh Tejan-Sie, MD   REASON FOR CONSULTATION: Hypertension, diabetes, and preop medical clearance.   HISTORY OF PRESENT ILLNESS: A 79 year old male patient with history of hypertension, diabetes mellitus, hyperlipidemia, presents to the Emergency Room complaining of constipation, abdominal pain, poor appetite; and he has not had a bowel movement in four days. The patient was found to have ileus and acute cholecystitis on the CT scan of the abdomen and admitted to the Surgical Service. The patient was found to have uncontrolled hypertension with blood pressure of 210/116 in the Emergency Room. Presently it is improving at 179/98. The patient mentions that he saw his primary care physician two weeks back when his blood pressure systolic was about 163 in the clinic, but he does check blood pressure at home every day, and it has been running between 130 to 150 every day. He has been compliant with medications, has not had any vomiting. He did not throw up his medications. The patient has had problems with blood pressure in the past, was admitted to the Intensive Care Unit with malignant hypertension on labetalol drip.   He does not have any chest pain, shortness of breath. Good functional status. No hear, lung or kidney problems, as per the patient.   Presently, the patient has an NG tube, is n.p.o. except medications of clonidine 0.2 mg oral b.i.d. has been started by Dr. Egbert GaribaldiBird.   PAST MEDICAL HISTORY:  1. Hypertension.  2. Diabetes.  3. Hyperlipidemia.  Dictation ended. ____________________________ Molinda BailiffSrikar R. Ariana Juul, MD srs:cbb D: 01/24/2012 08:27:21 ET T: 01/24/2012 11:32:13 ET JOB#: [id number] cc: Wardell HeathSrikar R. Meaghen Vecchiarelli, MD, <Dictator> Orie FishermanSRIKAR R Brittiany Wiehe MD ELECTRONICALLY SIGNED 01/27/2012  12:53

## 2014-07-05 NOTE — Consult Note (Signed)
Chief Complaint:   Subjective/Chief Complaint No new complaints Jeremiah Rivera/p lap chole. Transaminases elevated today. Gangrenous gallbladder and markedly inflamed bile duct per Surgery.   VITAL SIGNS/ANCILLARY NOTES: **Vital Signs.:   03-JKK-93 08:46   Vital Signs Type Pre Medication   Temperature Temperature (F) 98.2   Celsius 36.7   Temperature Source oral   Pulse Pulse 85   Respirations Respirations 18   Systolic BP Systolic BP 818   Diastolic BP (mmHg) Diastolic BP (mmHg) 76   Mean BP 99   Pulse Ox % Pulse Ox % 95   Pulse Ox Activity Level  At rest   Oxygen Delivery 1L  *Intake and Output.:   Daily 14-Nov-13 07:00   Grand Totals Intake:  1590 Output:  730    Net:  860 24 Hr.:  860   Oral Intake      In:  190   IV (Primary)      In:  1200   IV (Secondary)      In:  200   Urine ml     Out:  700   JP Drain ml     Out:  30   Length of Stay Totals Intake:  9292.33 Output:  2855    Net:  6437.33   Brief Assessment:   Cardiac Regular    Respiratory rhonchi    Gastrointestinal distended, bs active    Gastrointestinal details normal No rebound tenderness  No gaurding  No rigidity  No organomegaly  drains in situ   Lab Results: Hepatic:  14-Nov-13 05:33    Bilirubin, Total  2.6   Alkaline Phosphatase  251   SGPT (ALT)  133   SGOT (AST)  242   Total Protein, Serum 6.4   Albumin, Serum  2.0  Routine Chem:  14-Nov-13 05:33    Glucose, Serum  180   BUN  26   Creatinine (comp)  1.79   Sodium, Serum  133   Potassium, Serum 4.1   Chloride, Serum 103   CO2, Serum  16   Calcium (Total), Serum  7.9   Osmolality (calc) 276   eGFR (African American)  42   eGFR (Non-African American)  37 (eGFR values <77m/min/1.73 m2 may be an indication of chronic kidney disease (CKD). Calculated eGFR is useful in patients with stable renal function. The eGFR calculation will not be reliable in acutely ill patients when serum creatinine is changing rapidly. It is not useful in  patients  on dialysis. The eGFR calculation may not be applicable to patients at the low and high extremes of body sizes, pregnant women, and vegetarians.)   Anion Gap 14  Routine Hem:  14-Nov-13 05:33    WBC (CBC)  13.4   RBC (CBC)  3.63   Hemoglobin (CBC)  10.4   Hematocrit (CBC)  31.0   Platelet Count (CBC) 210 (Result(Mariko Nowakowski) reported on 30 Jan 2012 at 08:36AM.)   MCV 85   MCH 28.6   MCHC 33.5   RDW  15.7   Bands 3   Segmented Neutrophils 79   Lymphocytes 11   Variant Lymphocytes 1   Monocytes 3   Eosinophil 2   Metamyelocyte 1   Diff Comment 1 POIKILOCYTOSIS   Diff Comment 2 POLYCHROMASIA   Diff Comment 3 PLTS VARIED IN SIZE  Result(Fredda Clarida) reported on 30 Jan 2012 at 08:36AM.   Assessment/Plan:  Assessment/Plan:   Assessment Hypertensive urgency Cholecystitis Hyponatremia Acute kidney injury    Plan Ct diovan, amlodipine and carvedilol. Hold  hct  Maybell Misenheimer/p lap chol Hyponatremia- ct ns  Elevated transaminases-consult GI  AKI-ATN, although FeUrea is <35, give fluids as above. Nephrology following   Electronic Signatures: Veverly Fells (MD)  (Signed [phone] 09:27)  Authored: Chief Complaint, VITAL SIGNS/ANCILLARY NOTES, Brief Assessment, Lab Results, Assessment/Plan   Last Updated: 14-Nov-13 09:27 by Veverly Fells (MD)

## 2014-07-05 NOTE — Discharge Summary (Signed)
PATIENT NAME:  Jeremiah Rivera, Jeremiah Rivera MR#:  [id number] DATE OF BIRTH:  [birth year]  DATE OF ADMISSION:  01/24/2012 DATE OF DISCHARGE:  02/06/2012  TRANSFER SUMMARY  DIAGNOSES:  1. Acute aspiration with cardiopulmonary collapse and arrest requiring cardiopulmonary resuscitation and ventilator support.  2. Acute gangrenous cholecystitis with pericholecystic abscess and choledocholithiasis.  3. Hypertension.  4. Diabetes mellitus.  5. Obesity.  6. Acute renal failure. 7. Hyponatremia. 8. Hypokalemia.  PROCEDURES:  1. Laparoscopic cholecystectomy.  2. ERCP with stone extraction.   CONSULTANTS: Internal medicine, gastroenterology, nephrology.    HISTORY OF PRESENT ILLNESS/HOSPITAL COURSE: This is a 79 year old male admitted through the Emergency Room several days ago with signs of acute cholecystitis. It was unclear as to the etiology of his abdominal pain. It was believed to be constipation initially, but with subsequent work-up acute cholecystitis was identified. He was taken to the operating room where laparoscopic cholecystectomy was performed once his hyponatremia, hypokalemia, and acute renal failure were addressed. His creatinine went up as high as 2 and has decreased down to 1.7.  At surgery it was noted that the patient had a severely gangrenous gallbladder with a pericholecystic abscess. Postoperatively he became febrile, experienced a short bout of sepsis, which resolved rapidly on antibiotics and a drain was in place. In the postoperative period the patient's liver function tests became elevated, and a postoperative ERCP demonstrated a single stone in the distal common bile duct within the pancreatic head, which was removed by Dr. Bluford Kaufmannh of GI  via ERCP and stone extraction.    The patient was doing very well and was advancing his diet. He was on clears following the ERCP. On the morning following the ERCP the patient apparently aspirated and had a code arrest with cardiopulmonary collapse  requiring cardiopulmonary resuscitation with chest compressions and ventilator support.   Currently the patient is on a ventilator. He is on norepinephrine to support his blood pressure while on propofol, and is having something that is suspicious for seizure activity in the form of rapid twitching. He has had a CT scan of the head which was negative this afternoon, negative for any cranial gross abnormalities. An EEG was scheduled for tomorrow. The patient's [facility] and I have spoken to Dr. Molli KnockYacoub for transfer, and he has been accepted to the Intensive Care Unit [facility].  Apparently this dictation will not be transcribed after 3:00 on Sunday afternoon. Therefore, a hand typed brief summary has been included with copies of the chart to go with the patient. I will be available for discussion of the patient's care at any time. Phone numbers were given.     ____________________________ Adah Salvageichard E. Excell Seltzerooper, MD rec:bjt D: 02/15/2012 15:28:33 ET T: 02/15/2012 16:14:51 ET JOB#: [id number]  cc: Adah Salvageichard E. Excell Seltzerooper, MD, <Dictator> Lattie HawICHARD E Makisha Marrin MD ELECTRONICALLY SIGNED 01/28/2012 18:59

## 2014-07-05 NOTE — Consult Note (Signed)
Chief Complaint:   Subjective/Chief Complaint Abdominal pain continues to improve. BP has improved but is still elevated. Home BP meds not started.   VITAL SIGNS/ANCILLARY NOTES: **Vital Signs.:   10-Nov-13 10:18   Temperature Temperature (F) 98.9   Celsius 37.1   Temperature Source oral   Pulse Pulse 87   Respirations Respirations 20   Systolic BP Systolic BP 543   Diastolic BP (mmHg) Diastolic BP (mmHg) 81   Mean BP 106   Pulse Ox % Pulse Ox % 96   Pulse Ox Activity Level  At rest   Oxygen Delivery Room Air/ 21 %  *Intake and Output.:   Daily 10-Nov-13 07:00   Grand Totals Intake:  5838 Output:  1725    Net:  4113 24 Hr.:  4113   IV (Primary)      In:  Ahsoka.Rossetti   Urine ml     Out:  1725   Length of Stay Totals Intake:  5838 Output:  6067    Net:  7034   Brief Assessment:   Cardiac Regular  no murmur    Respiratory normal resp effort  wheezing    Gastrointestinal less distended but not tender, no guarding or rigidity bs hypoactive    Gastrointestinal details normal Nontender   Lab Results:  Hepatic:  10-Nov-13 04:47    Bilirubin, Total 0.8   Alkaline Phosphatase 71   SGPT (ALT) 15   SGOT (AST) 17   Total Protein, Serum 6.9   Albumin, Serum  2.4  Routine Chem:  10-Nov-13 04:47    Glucose, Serum  200   BUN  21   Creatinine (comp)  1.63   Sodium, Serum  124   Potassium, Serum 3.8   Chloride, Serum  92   CO2, Serum  20   Calcium (Total), Serum  7.9   Osmolality (calc) 258   eGFR (African American)  47   eGFR (Non-African American)  41 (eGFR values <32m/min/1.73 m2 may be an indication of chronic kidney disease (CKD). Calculated eGFR is useful in patients with stable renal function. The eGFR calculation will not be reliable in acutely ill patients when serum creatinine is changing rapidly. It is not useful in  patients on dialysis. The eGFR calculation may not be applicable to patients at the low and high extremes of body sizes, pregnant women, and  vegetarians.)   Anion Gap 12  Routine Hem:  10-Nov-13 04:47    WBC (CBC)  16.3   RBC (CBC)  4.02   Hemoglobin (CBC)  11.5   Hematocrit (CBC)  34.0   Platelet Count (CBC)  112   MCV 85   MCH 28.6   MCHC 33.8   RDW  15.1   Neutrophil % 85.0   Lymphocyte % 6.9   Monocyte % 7.4   Eosinophil % 0.6   Basophil % 0.1   Neutrophil #  13.8   Lymphocyte # 1.1   Monocyte #  1.2   Eosinophil # 0.1   Basophil # 0.0 (Result(Zuleima Haser) reported on 26 Jan 2012 at 06:14AM.)   Assessment/Plan:  Assessment/Plan:   Assessment Hypertensive urgency Cholelithiasis, Constipation Leukocytosis-normal ua, afebrile Hyponatremia Acute kidney injury    Plan Ct diovan, amlodipine and carvedilol. Hold hct  Ct enemas per Gen Surg. HIDA scan planned today Hyponatremia- with elevated cr suggests combined intravascular salt and water depletion;change d5half ns to ns,  AKI-probably prerenal check FeUrea, give fluids as above Send stool for cdiff. Blood cultures if spikes temp  Electronic Signatures: Veverly Fells (MD)  (Signed [id number] 12:00)  Authored: Chief Complaint, VITAL SIGNS/ANCILLARY NOTES, Brief Assessment, Lab Results, Assessment/Plan   Last Updated: 10-Nov-13 12:00 by Veverly Fells (MD)

## 2014-07-05 NOTE — Consult Note (Signed)
Chief Complaint:   Subjective/Chief Complaint Asked to see for possible ERCP. Had repeat HIDA scan which reportedly showed no drainage into CBD or duodenum. I do not have the actual HIDA scan report yet.Pt without abd pain but does look jaundiced.   VITAL SIGNS/ANCILLARY NOTES: **Vital Signs.:   15-Nov-13 10:01   Vital Signs Type Q 4hr   Temperature Temperature (F) 98.3   Celsius 36.8   Temperature Source oral   Pulse Pulse 74   Respirations Respirations 19   Systolic BP Systolic BP 027   Diastolic BP (mmHg) Diastolic BP (mmHg) 70   Mean BP 95   Pulse Ox % Pulse Ox % 97   Pulse Ox Activity Level  At rest   Oxygen Delivery Room Air/ 21 %   Brief Assessment:   Cardiac Regular    Respiratory clear BS    Gastrointestinal Normal   Lab Results: Hepatic:  15-Nov-13 06:29    Bilirubin, Direct  2.80 (Result(s) reported on 31 Jan 2012 at 07:58AM.)   Bilirubin, Total  3.6   Alkaline Phosphatase  288   SGPT (ALT)  113   SGOT (AST)  128   Total Protein, Serum  6.3   Albumin, Serum  1.9  Routine Chem:  15-Nov-13 06:29    Glucose, Serum  172   BUN  28   Creatinine (comp)  1.85   Sodium, Serum  133   Potassium, Serum 4.0   Chloride, Serum 103   CO2, Serum  17   Calcium (Total), Serum  7.9   Osmolality (calc) 276   eGFR (African American)  41   eGFR (Non-African American)  35 (eGFR values <31m/min/1.73 m2 may be an indication of chronic kidney disease (CKD). Calculated eGFR is useful in patients with stable renal function. The eGFR calculation will not be reliable in acutely ill patients when serum creatinine is changing rapidly. It is not useful in  patients on dialysis. The eGFR calculation may not be applicable to patients at the low and high extremes of body sizes, pregnant women, and vegetarians.)   Anion Gap 13  Routine Hem:  15-Nov-13 06:29    WBC (CBC)  13.0   RBC (CBC)  3.53   Hemoglobin (CBC)  9.9   Hematocrit (CBC)  30.1   Platelet Count (CBC) 258   MCV 85    MCH 28.0   MCHC 32.9   RDW  15.6   Neutrophil % 77.2   Lymphocyte % 10.2   Monocyte % 9.3   Eosinophil % 2.9   Basophil % 0.4   Neutrophil #  10.1   Lymphocyte # 1.3   Monocyte #  1.2   Eosinophil # 0.4   Basophil # 0.1 (Result(s) reported on 31 Jan 2012 at 10:26AM.)   Assessment/Plan:  Assessment/Plan:   Assessment Worsening LFT. Abnormal HIDA. Pt eating lunch when I checked into his room.    Plan Plan ERCP tomorrow AM to evaluate CBD for CBD stones/etc. Spent time discussing procedure with patient and son in detail as well as potential risks. NPO after MN.   Electronic Signatures: OVerdie Shire(MD)  (Signed [id number]:07)  Authored: Chief Complaint, VITAL SIGNS/ANCILLARY NOTES, Brief Assessment, Lab Results, Assessment/Plan   Last Updated: 15-Nov-13 13:07 by OVerdie Shire(MD)

## 2014-07-05 NOTE — Op Note (Signed)
PATIENT NAME:  Jeremiah Rivera, Jeremiah Rivera MR#:  [id number] DATE OF BIRTH:  [birth year]  DATE OF PROCEDURE:  01/29/2012  PREOPERATIVE DIAGNOSIS: Acute cholecystitis.   POSTOPERATIVE DIAGNOSIS: Acute gangrenous cholecystitis.  PROCEDURE: Laparoscopic cholecystectomy.   SURGEON: Jeremiah Rivera, M.D.   ANESTHESIA: General with endotracheal tube.   INDICATIONS: This is a patient with acute cholecystitis by physical exam. Preoperatively we discussed the rationale for surgery, the options of observation, risk of bleeding, infection, recurrence of symptoms, failure to resolve his symptoms, open procedure, bile duct damage, bile duct leak, retained common bile duct stone, any of which could require further surgery and/or ERCP, stent, and papillotomy. This was all reviewed for him and his son multiple times, most recently with the patient alone earlier this morning.   FINDINGS: Acute gangrenous cholecystitis with a contained perforation and abscess on the medial side of the gallbladder.   DESCRIPTION OF PROCEDURE: The patient was induced to general anesthesia. He was on IV antibiotics. VTE prophylaxis was in place. He was prepped and draped in a sterile fashion. Marcaine was infiltrated in skin and subcutaneous tissues around the periumbilical where an incision was made. A Veress needle was placed. Pneumoperitoneum was obtained, and a 5 mm trocar port was placed. The abdominal cavity was explored and under direct vision a 10 mm epigastric port and two lateral 5 mm ports were placed. The gallbladder was not immediately visible. It was covered in omentum. Omentum was adherent to the liver. This was taken down bluntly, and ultimately a gangrenous gallbladder was identified with patchy gangrene over the fundus.  It was elevated and blunt dissection removed the omental adhesions; and in so doing, there was evidence of a contained perforation with purulence on the medial side of the gallbladder which was aspirated of pus.    Further dissection down to the infundibulum of the gallbladder was performed. Multiple small vessels were doubly clipped and divided. Ultimately, the cystic duct-gallbladder junction was well identified, doubly clipped and divided, and then the gallbladder was taken from a completely necrotic gallbladder fossa with minimal electrocautery and mostly blunt dissection.  There was very little bleeding, if any at all. Hemostasis was with electrocautery laterally on each side of the gallbladder fossa. The specimen was passed out through the lateral port site with the aid of an EndoCatch bag. A 30 mm angled scope was ultimately utilized as well to aid in dissection and to aid in retrieval of spilled stones. Irrigation was performed, and then a 10 mm JP drain was brought in through the epigastric port site, placed in the foramen of Winslow, and brought out through a lateral port site, held in place with the 3-0 nylon.   Again, hemostasis was adequate. There was no sign of bile leak or bowel injury. A camera was placed in the epigastric site to view back to the periumbilical site to ensure that no bowel injury had occurred, and there was none noticed, no adhesions.   Once assuring that hemostasis was adequate, the pneumoperitoneum was released. All ports were removed. Fascial edges at the enlarged epigastric port site was were approximated with multiple figure-of-eight 0 Vicryls, and then skin staples were placed, and the drain was placed to bulb suction. Sterile dressings were placed.   The patient tolerated this procedure well. There were no complications. He was taken to the recovery room in stable condition to be admitted for continued care.   ____________________________ Jeremiah Salvageichard E. Excell Seltzerooper, Jeremiah Rivera rec:cbb D: 01/29/2012 14:53:45 ET T: 01/29/2012 15:00:08 ET JOB#: [id number]  cc: Jeremiah Salvage. Excell Seltzer, Jeremiah Rivera, <Dictator> Jeremiah Rivera ELECTRONICALLY SIGNED 01/30/2012 12:15

## 2014-07-05 NOTE — Consult Note (Signed)
Brief Consult Note: Diagnosis: Abrupt rise in liver enzymes following cholecystectomy.   Patient was seen by consultant.   Consult note dictated.   Orders entered.   Comments: S/P complex gallbladder surgery with  gangrenous cholecystitis with contained perforation and abscess on the medial side of the gb which was aspirated of pus. The gallbaldder contained multiple gallstones. One day post-op liver enzymes elevated from AST 30 to 242, ALT 30 to 133, t bili 0.5 to 2.6. Etiology to r/o  trapped stone in CBD, liver irritation from infection/surgery, doubt drug side effect from antibiotics. PLAN: MRCP today, check ammonia level as pt is drowsy, hepatitis panel, daily liver labs. Case d/w Dr. Mechele CollinElliott in collaboration of care. further gi recommendations to follow.  Electronic Signatures: Rowan BlaseMills, Simonne Boulos Ann (NP)  (Signed [phone]-Nov-13 16:06)  Authored: Brief Consult Note   Last Updated: 14-Nov-13 16:06 by Rowan BlaseMills, Keval Nam Ann (NP)

## 2014-07-05 NOTE — Consult Note (Signed)
Chief Complaint:   Subjective/Chief Complaint Slightly confused this am. Sodium lower, Cr higher today. HIDA scan noted.   VITAL SIGNS/ANCILLARY NOTES: **Vital Signs.:   11-Nov-13 10:32   Temperature Temperature (F) 99.5   Celsius 37.5   Temperature Source Oral   Pulse Pulse 77   Respirations Respirations 20   Systolic BP Systolic BP 683   Diastolic BP (mmHg) Diastolic BP (mmHg) 82   Mean BP 106   Pulse Ox % Pulse Ox % 95   Pulse Ox Activity Level  At rest   Oxygen Delivery Room Air/ 21 %  *Intake and Output.:   Daily 11-Nov-13 07:00   Grand Totals Intake:  1585 Output:      Net:  4196 22 Hr.:  2979   Oral Intake      In:  600   IV (Primary)      In:  985   Length of Stay Totals Intake:  7423 Output:  8921    Net:  5698   Brief Assessment:   Cardiac Regular    Respiratory rhonchi    Gastrointestinal distended, bs hypoactive    Gastrointestinal details normal No rebound tenderness  No gaurding  No rigidity  No organomegaly    Additional Physical Exam CNS-oriented to name, time place but couldn't recall who I was   Lab Results: Hepatic:  11-Nov-13 04:53    Bilirubin, Total 0.8   Alkaline Phosphatase 79   SGPT (ALT) 22   SGOT (AST) 31   Total Protein, Serum 6.7   Albumin, Serum  2.3  Routine Chem:  11-Nov-13 04:53    Glucose, Serum  155   BUN  27   Creatinine (comp)  1.76   Sodium, Serum  125   Potassium, Serum  3.4   Chloride, Serum  96   CO2, Serum  18   Calcium (Total), Serum  7.9   Osmolality (calc) 260   eGFR (African American)  43   eGFR (Non-African American)  37 (eGFR values <23m/min/1.73 m2 may be an indication of chronic kidney disease (CKD). Calculated eGFR is useful in patients with stable renal function. The eGFR calculation will not be reliable in acutely ill patients when serum creatinine is changing rapidly. It is not useful in  patients on dialysis. The eGFR calculation may not be applicable to patients at the low and high extremes  of body sizes, pregnant women, and vegetarians.)   Anion Gap 11   Amylase, Serum 38 (Result(Anagha Loseke) reported on 27 Jan 2012 at 05:52AM.)   Lipase 327 (Result(Alieyah Spader) reported on 27 Jan 2012 at 05:52AM.)  Routine Hem:  11-Nov-13 04:53    WBC (CBC)  16.1   RBC (CBC)  3.84   Hemoglobin (CBC)  10.4   Hematocrit (CBC)  32.7   Platelet Count (CBC)  130   MCV 85   MCH 27.0   MCHC  31.7   RDW  15.2   Neutrophil % 80.5   Lymphocyte % 7.8   Monocyte % 9.3   Eosinophil % 2.1   Basophil % 0.3   Neutrophil #  13.0   Lymphocyte # 1.2   Monocyte #  1.5   Eosinophil # 0.3   Basophil # 0.0 (Result(Tahisha Hakim) reported on 27 Jan 2012 at 05:41AM.)   Radiology Results: Nuclear Med:    10-Nov-13 17:18, Hepatobiliary Image - Nuc Med   Hepatobiliary Image - Nuc Med    REASON FOR EXAM:    equivocal evidence of acute cholecystitis  COMMENTS:  PROCEDURE: NM  - NM HEPATOBILIARY IMAGE  - Jan 26 2012  5:18PM     RESULT: History: Cholecystitis    Technique: 8.69 mCi Tc-60mlabeled Mebrofenin was administered   intravenously. .    Comparisons: None.    Findings:     Flow study shows uniform distribution of radioactivity in the liver.     Sequential imaging of the liver and abdomen over a period of 1 hour shows   uniform uptake of the tracer in the liver with appearance of activity in   the biliary tract with within normal time range and the appearance of   activity in the intestines at a normal time range. At the end of 1-hour   there is relative good clearance of activity from the liver.    The gallbladder is nonvisualized at one hour. At 4 hours the gallbladder   continues not be visualized with a majority of the radiotracer excreted   from the liver.    IMPRESSION:   1. No gallbladder activity is visualized at 4 hours. Differential   diagnosis includesacute or chronic cholecystitis or cystic duct   obstruction versus prolonged fasting(>24h).    Dictation Site: 1          Verified By: HJennette Banker M.D., MD   Assessment/Plan:  Assessment/Plan:   Assessment Hypertensive urgency Cholelithiasis Leukocytosis-normal ua, afebrile Hyponatremia Acute kidney injury    Plan Ct diovan, amlodipine and carvedilol. Hold hct  Possible surgery in AM  per surgery Hyponatremia- with elevated cr suggests combined intravascular salt and water depletion;Increase rate of NS Hypokalemia-supplement AKI-probably prerenal check FeUrea, give fluids as above. Consult nephrology Send stool for cdiff. Blood cultures if spikes temp   Electronic Signatures: TVeverly Fells(MD)  (Signed 11-Nov-13 14:02)  Authored: Chief Complaint, VITAL SIGNS/ANCILLARY NOTES, Brief Assessment, Lab Results, Radiology Results, Assessment/Plan   Last Updated: 11-Nov-13 14:02 by TVeverly Fells(MD)

## 2014-07-05 NOTE — Consult Note (Signed)
20 Fr G tube successfully placed though with difficulty. Hematoma developed underneath the skin from poking with trocar. Gastritis present. Make sure to continue daily protonix. Ok to use G tube to flush and give oral meds. If stable, ok to start using TF slowly tomorrow. Can resume ASA and heparin tomorrow PROVIDED that hematoma has not gotten worse overnight. Will follow. Thanks.  Electronic Signatures: Lutricia Feilh, Floyd Wade (MD)  (Signed on 15-Nov-13 14:15)  Authored  Last Updated: 15-Nov-13 14:15 by Lutricia Feilh, Foster Frericks (MD)

## 2014-07-05 NOTE — Consult Note (Signed)
I spoke with radiologist who is concerned of potential effect of MRI on clips after surgery and if any other test can be used to further clarify his elevated LFT's it would be recommended over the MRCP.  Will get HIDA scan tomorrow instead.  Electronic Signatures: Scot JunElliott, Robert T (MD)  (Signed on [phone]-Nov-13 17:15)  Authored  Last Updated: [id number]: 14-Nov-13 17:15 by Scot JunElliott, Robert T (MD)

## 2014-07-05 NOTE — Consult Note (Signed)
PATIENT NAME:  Jeremiah Rivera, Jeremiah Rivera MR#:  [id number] DATE OF BIRTH:  06-28-1935  DATE OF CONSULTATION:  01/24/2012  REFERRING PHYSICIAN:   CONSULTING PHYSICIAN:  Katelynd Blauvelt R. Osa Campoli, MD  ADDENDUM: Continuation of dictation.  SOCIAL HISTORY: The patient lives at home alone. No smoking. No alcohol. No illicit drugs. Ambulates on his own.   CODE STATUS: FULL CODE.   FAMILY HISTORY: Hypertension and diabetes.   ALLERGIES: ACE inhibitors.   HOME MEDICATIONS:  1. Toprol-XL 50 mg oral once a day.  2. Enalapril 10 mg oral daily.  3. Hydrochlorothiazide 25 mg oral daily.  4. Aspirin 81 mg oral daily.  5. K-Dur 20 milliequivalents by mouth daily.  6. Magnesium sulfate 400 mg by month daily.   REVIEW OF SYSTEMS: CONSTITUTIONAL: No weakness, fatigue, weight loss, or weight gain.  EYES: No redness, pain, or blurred vision. ENT: No tinnitus, hearing loss, or ringing. CARDIOVASCULAR: No chest pain, PND, or orthopnea. RESPIRATORY: No shortness of breath, wheezing, or cough. GI: Complains of constipation and abdominal pain. No nausea or vomiting. GENITOURINARY: No dysuria or frequency or hematuria. SKIN: No rash or ulcers. HEMATOLOGIC: No easy bruising or bleeding. PSYCHIATRIC: No anxiety or depression. NEUROLOGIC: No focal weakness, numbness, or dysarthria. ENDOCRINE: No hypothyroidism. Does have diabetes.   PHYSICAL EXAMINATION:  VITAL SIGNS: Temperature 98.4, pulse 98, blood pressure 210/116 and improved to 178/98, and saturating 99% on room air.   GENERAL: Obese, African American male patient lying in bed, comfortable, conversational, cooperative with exam. NG tube in place.   PSYCHIATRIC: Alert and oriented x3. Mood and affect appropriate. Judgment intact.   HEENT: Atraumatic, normocephalic. NG tube. Oral mucosa dry and pink. External ears and nose normal. No pallor. No icterus. Pupils bilaterally equal and reactive to light.   NECK: Supple. No thyromegaly. No palpable lymph nodes. Trachea midline. No  carotid bruit or JVD.   CARDIOVASCULAR: S1 and S2 regular rate and rhythm without any murmurs. Peripheral pulses 2+. No edema.   RESPIRATORY: Normal work of breathing. Clear to auscultation on both sides.   GASTROINTESTINAL: Soft and distended. Bowel sounds diminished. No tenderness, no rigidity or guarding.   GENITOURINARY: No CVA tenderness or bladder distention.   SKIN: Warm and dry. No petechiae, rash, or ulcers.   MUSCULOSKELETAL: No joint swelling, redness, or effusion of the large joints. Normal muscle tone.   NEURO: Motor strength 5 out of 5 in upper and lower extremities. Sensation to fine touch intact all over.   LYMPHATIC: No cervical lymphadenopathy.   LABS/RADIOLOGIC STUDIES: Glucose 225, BUN 13, creatinine 1.27, sodium 131, potassium 4.2, chloride 96, GFR 55, and albumin 3.6. WBC 12.4, hemoglobin 13.9, and platelets 139.   Urinalysis shows no WBC or bacteria.  CT scan of the abdomen showed ileus, possible cholecystitis. Final radiology report pending.   ASSESSMENT AND PLAN:  1. Hypertensive urgency. The patient does seem to have uncontrolled hypertension even at home running blood pressures as high as 160. This could have been worsened by the pain the patient has.  We will start him on IV hydralazine and labetalol to keep blood pressures less than 160. We will also continue his home medications. No signs of end-organ damage at this time. Needs to be monitored closely with every 4 hour vitals. I will decrease the clonidine to 0.1 mg twice a day from the 0.2 mg twice a day that has been started this admission.  2. Ileus. The patient has diminished bowel sounds, is passing gas. Further management as per Dr. Egbert Garibaldi  and team. The patient should be at low risk for abdominal surgery considering he does not have any acute cardiac, liver, or kidney issues.  3. Diabetes mellitus. Sliding scale insulin, diabetic low salt diet when started on food.   CODE STATUS: FULL CODE.   TIME  SPENT: Time spent today on this case was 60 minutes with more than 50% time spent in coordination of care.  ____________________________ Molinda BailiffSrikar R. Dailon Sheeran, MD srs:slb D: 01/24/2012 08:35:37 ET T: 01/24/2012 11:09:48 ET JOB#: [id number]  cc: Wardell HeathSrikar R. Annelle Behrendt, MD, <Dictator> Sheikh A. Ellsworth Lennoxejan-Sie, MD Redge GainerMark A. Egbert GaribaldiBird, MD Orie FishermanSRIKAR R Hannah Crill MD ELECTRONICALLY SIGNED 01/27/2012 12:53

## 2014-07-05 NOTE — Consult Note (Signed)
PATIENT NAME:  Jeremiah Rivera, Jeremiah Rivera MR#:  [id number] DATE OF BIRTH:  06/30/1935  DATE OF CONSULTATION:  01/30/2012  REFERRING PHYSICIAN:  Bluford MainSheikh Tejan-Sie, MD CONSULTING PHYSICIAN: Lynnae Prudeobert Elliott, MD / Ranae PlumberKimberly A. Jibran Crookshanks, ANP  REASON FOR CONSULTATION: Abnormal liver enzymes.   HISTORY OF PRESENT ILLNESS: This 79 year old patient who was admitted to the hospital 01/24/2012 with constipation, abdominal pain, and poor appetite, was found to have an ileus and acute cholecystitis on CT scan. HIDA scan showed no activity. Abdominal ultrasound showed gallstones. He did have elevated blood pressure and acute renal failure. The patient underwent laparoscopic cholecystectomy 01/29/2012 for findings of acute gangrenous cholecystitis with contained perforation and abscess on the medial side of the gallbladder. The patient had liver enzymes in the normal range, and first day postoperative today his AST went from 30 to 242, ALT from 30 to 133, and total bilirubin 0.5 to 2.6. His WBC has decreased to 13.4, hemoglobin is stable at 10.4, normal platelet count. Gastroenterology is asked to see the patient regarding further evaluation and management.   PAST MEDICAL HISTORY: The patient is drowsy, unable to give detailed past medical history and records reviewed. Admission note with the following. 1. Hypertension.  2. Diabetes mellitus.  3. Hyperlipidemia.   HOME MEDICATIONS:  1. Toprol-XL 50 mg once daily.  2. Enalapril 10 mg daily.  3. Hydrochlorothiazide 25 mg daily.  4. Aspirin 81 mg daily.  5. K-Dur 20 mEq daily.  6. Magnesium sulfate 40 mg daily.   ALLERGIES: ACE inhibitors.   HABITS: No tobacco, alcohol, or illicit drug use.   FAMILY HISTORY: Mother deceased with renal failure at age 77. Negative for GI malignancy.   SOCIAL HISTORY: Lives at home alone. Son, Roger ShelterGordon, at bedside.   REVIEW OF SYSTEMS: The patient is drowsy from surgery yesterday, son reports he has not awaken enough as much as the doctors  would like. The patient answers questions, says he feels pretty good. The patient reports abdominal pain has resolved. He has been on clear liquids, has not eaten food. 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 Abdomen size is a little distended, a little larger than normal. He has a drain in, says it does not hurt him at all. The patient denies any chest pain or shortness of breath. He was complaining of constipation on arrival. Denies constipation currently. Remaining 10 systems negative.  PHYSICAL EXAMINATION:   VITAL SIGNS: 98.2, 85, 18, 146/74, pulse oximetry room air is 98%.   GENERAL: Elderly African American male, obese, resting in bed.   HEENT: Head is normocephalic. Conjunctivae pink. Sclerae very slight icteric. Oral mucosa is dry and intact.   HEART: Heart tones S1 and S2 without murmur or gallop.   LUNGS: Clear to auscultation anteriorly.   ABDOMEN: Distended, soft. Very light palpation, no tenderness. Staples intact. Wounds are dry and he has a drain in place covered with dressing, not examined.   RECTAL: Deferred.   EXTREMITIES: Lower extremities with scant edema. Hands with scant edema.   NEUROLOGIC: The patient answers questions. Speech is clear. A little lethargic, falls back to sleep, arouses easily with spoken word. Tries to follow commands.  MUSCULOSKELETAL: No joint swelling or effusion, this is a large-framed male.   LABS/RADIOLOGIC STUDIES: Admission blood work with glucose 225, BUN 13, creatinine 1.7, and sodium 131. WBC 12.4, increased to 19.2 and now down to 13.4. BUN today is 26, creatinine 1.79, and sodium 133. Platelet count initially 139 to 112 and now 210. Admission liver labs, 01/23/2012, with  albumin 3.6, total bilirubin 0.6, alkaline phosphatase 76, AST 21, and ALT 24. Today albumin is 2.0, total bilirubin 2.6, alkaline phosphatase 51, AST 242, and ALT 133. Pro time 17.7. INR 1.4. PTT 45.5, on 01/25/2012.  C. difficile negative.   Chest x-ray, single view, 01/29/2012:  Endotracheal tube present. Patchy densities which could represent underlying infiltrate.   Presurgical CT, abdominal ultrasound, and HIDA scan as noted in history of present illness.   IMPRESSION: The patient is postoperative day one laparoscopic cholecystectomy for acute gangrenous cholecystitis with contained perforation and abscess on the medial side of the gallbladder. The gallbladder had multiple stones. He presents now overnight with rise in LFTs including bilirubin. The patient denies abdominal pain. He has been a little lethargic, according to family. He does have a drain in place. Leukocytosis is improving. He has been on IV antibiotic therapy and is currently receiving Unasyn. The patient is on a proton pump inhibitor orally once daily.   PLAN: This case was discussed with Dr. Mechele Collin in collaboration of care. The question is whether he has a trapped stone in the common bile duct or whether the rise in liver enzymes abruptly is secondary to a surgical procedure and infection. A MRCP should answer the question whether there is a trapped stone. If there is no trapped stone present, then would simply follow the liver enzymes daily and would expect to have improvement. We will check an ammonia level, daily liver panel, and hepatitis A, B, and C panel. Continue with Protonix once daily orally. Further GI recommendations pending results of the MRCP. Thank you for the gastroenterology consultation.   These services are provided by Cala Bradford A. Arvilla Market, RN, MS, APRN, [facility], ANP under collaborative agreement with Scot Jun, MD.  ____________________________ Ranae Plumber Arvilla Market, ANP kam:slb D: 01/30/2012 16:26:32 ET      T: 01/30/2012 17:17:55 ET     JOB#: [id number] cc: Cala Bradford A. Arvilla Market, ANP, <Dictator> Ranae Plumber. Suzette Battiest, MSN, ANP-BC Adult Nurse Practitioner ELECTRONICALLY SIGNED 01/31/2012 9:20

## 2014-07-05 NOTE — H&P (Signed)
Subjective/Chief Complaint 4 days of constipation    History of Present Illness 22 yom presents to ER with 3-4 days of constipation and mild abdominal swelling,  admits to some vague pain in the ruq and epigastrum.  No fevers, no jaundice, difficult historian, most of hx obtained from son. no nausea or vomiting. no anorexia.    Past History hypertension ?diabetes obesity    Past Medical Health Hypertension, Diabetes Mellitus, obesity    Primary Physician Tejanse.   Past Med/Surgical Hx:  HTN:   Hyperlipidemia:   Denies:   ALLERGIES:  Ace Inhibitors: Swelling, Other  HOME MEDICATIONS: Medication Instructions Status  magnesium sulfate 464m by mouth daily  Active  k dur 20 meq by mouth daily  Active  hydrochlorothiazide tablet 25 mg by mouth daily  Active  enalapril tablet 10 mg by mouth daily  Active  Toprol-XL tablet, extended release 50 mg 1 tab(s) orally twice daily Active  asprin 849mby mouth every day  Active   Family and Social History:   Family History Non-Contributory    Social History negative tobacco, negative ETOH, negative Illicit drugs    Place of Living Home   Review of Systems:   Subjective/Chief Complaint see above.    Abdominal Pain Yes    Constipation Yes    Nausea/Vomiting No    SOB/DOE No    Chest Pain No    Tolerating Diet Yes   Physical Exam:   GEN obese, disheveled    HEENT pale conjunctivae    NECK supple    RESP normal resp effort  clear BS    CARD regular rate    ABD positive tenderness  no liver/spleen enlargement  soft  distended  hypoactive BS  mild tenderness in ruq.    EXTR negative cyanosis/clubbing, negative edema    SKIN normal to palpation    NEURO cranial nerves intact    PSYCH A+O to time, place, person   Lab Results: Hepatic:  07-Nov-13 19:31    Bilirubin, Total 0.6   Alkaline Phosphatase 76   SGPT (ALT) 24   SGOT (AST) 21   Total Protein, Serum  8.3   Albumin, Serum 3.6  Routine Chem:   07-Nov-13 19:31    Lipase 142 (Result(s) reported on 24 Jan 2012 at 07:13AM.)   Glucose, Serum  225   BUN 13   Creatinine (comp) 1.27   Sodium, Serum  131   Potassium, Serum 4.2   Chloride, Serum  96   CO2, Serum 24   Calcium (Total), Serum 9.5   Osmolality (calc) 270   eGFR (African American) >60   eGFR (Non-African American)  55 (eGFR values <6076min/1.73 m2 may be an indication of chronic kidney disease (CKD). Calculated eGFR is useful in patients with stable renal function. The eGFR calculation will not be reliable in acutely ill patients when serum creatinine is changing rapidly. It is not useful in  patients on dialysis. The eGFR calculation may not be applicable to patients at the low and high extremes of body sizes, pregnant women, and vegetarians.)   Anion Gap 11  Routine UA:  07-Nov-13 19:31    Color (UA) Yellow   Clarity (UA) Clear   Glucose (UA) >=500   Bilirubin (UA) Negative   Ketones (UA) Negative   Specific Gravity (UA) 1.017   Blood (UA) 1+   pH (UA) 6.0   Protein (UA) 100 mg/dL   Nitrite (UA) Negative   Leukocyte Esterase (UA) Negative (Result(s) reported on  23 Jan 2012 at 07:49PM.)   RBC (UA) 2 /HPF   WBC (UA) 1 /HPF   Bacteria (UA) NONE SEEN   Epithelial Cells (UA) NONE SEEN  Result(s) reported on 23 Jan 2012 at 07:49PM.  Routine Hem:  07-Nov-13 19:31    WBC (CBC)  12.4   RBC (CBC) 4.94   Hemoglobin (CBC) 13.9   Hematocrit (CBC) 42.3   Platelet Count (CBC)  139 (Result(s) reported on 23 Jan 2012 at 07:55PM.)   MCV 86   MCH 28.1   MCHC 32.8   RDW  15.1     Assessment/Admission Diagnosis 64 yom with diabetes, htn, constipation (ileus) and what looks like acute cholecystitis on ct scan.    Plan admit, [place] RUQ, medicine consult for diabetes and htn management,pre-op evaluation. will remove ngt as stomach not distended and no emesis in history. discussed with patient and son likelihood of surgical intervention.   will need bp addressed as has  history of significant hypertension needing admission in the past. dvt/gi prophylaxsis.   Electronic Signatures: Sherri Rad (MD)  (Signed [phone] 07:31)  Authored: CHIEF COMPLAINT and HISTORY, PAST MEDICAL/SURGIAL HISTORY, ALLERGIES, HOME MEDICATIONS, FAMILY AND SOCIAL HISTORY, REVIEW OF SYSTEMS, PHYSICAL EXAM, LABS, ASSESSMENT AND PLAN   Last Updated: 08-Nov-13 07:31 by Sherri Rad (MD)

## 2014-07-05 NOTE — Consult Note (Signed)
Chief Complaint:   Subjective/Chief Complaint No new complaints Jeremiah Rivera/p lap chole. Transaminases still elevated although trending downwards. Planned ercp per GI, input appreciated.   VITAL SIGNS/ANCILLARY NOTES: **Vital Signs.:   15-Nov-13 10:01   Vital Signs Type Q 4hr   Temperature Temperature (F) 98.3   Celsius 36.8   Temperature Source oral   Pulse Pulse 74   Respirations Respirations 19   Systolic BP Systolic BP 146   Diastolic BP (mmHg) Diastolic BP (mmHg) 70   Mean BP 95   Pulse Ox % Pulse Ox % 97   Pulse Ox Activity Level  At rest   Oxygen Delivery Room Air/ 21 %  *Intake and Output.:   Daily 15-Nov-13 07:00   Grand Totals Intake:  673.8 Output:  925    Net:  -251.2 24 Hr.:  -251.2   IV (Primary)      In:  535.4   IV (Secondary)      In:  138.4   Urine ml     Out:  850   JP Drain ml     Out:  75   Length of Stay Totals Intake:  1610.969966.13 Output:  3780    Net:  6186.13   Brief Assessment:   Cardiac Regular    Respiratory rhonchi    Gastrointestinal distended, bs active    Gastrointestinal details normal No rebound tenderness  No gaurding  No rigidity  No organomegaly   Assessment/Plan:  Assessment/Plan:   Assessment Hypertensive urgency Cholecystitis Hyponatremia Acute kidney injury    Plan Ct amlodipine and carvedilol. Hold hct  Jeremiah Rivera/p lap chol Hyponatremia- improved Elevated transaminases-ERCP today for further eval  AKI-ATN, cr higher today, fluids restarted Nephrology following   Electronic Signatures: Charlesetta Garibaldiejan-Sie, Sheikh A (MD)  (Signed [id number]-Nov-13 13:41)  Authored: Chief Complaint, VITAL SIGNS/ANCILLARY NOTES, Brief Assessment, Assessment/Plan   Last Updated: 15-Nov-13 13:41 by Charlesetta Garibaldiejan-Sie, Sheikh A (MD)

## 2014-07-05 NOTE — Consult Note (Signed)
Difficult ERCP. Small stone imbedded at the distal end of CBD. Needle knife sphincterotomy performed to access CBD. Single small stone extracted. Since both CBD and PD opacified with multiple poking of ampulla, keep patient on clears today. Recheck LFT, which should improve. Watch for pancreatitis. Thanks.  Electronic Signatures: Lutricia Feilh, Jonathan Corpus (MD)  (Signed on 16-Nov-13 09:20)  Authored  Last Updated: [id number]: 16-Nov-13 09:20 by Lutricia Feilh, Shaela Boer (MD)

## 2014-07-05 NOTE — Consult Note (Signed)
Chief Complaint:   Subjective/Chief Complaint Pt still off floor Jeremiah Rivera/p lap chole for gangrenous cholecystitis   Assessment/Plan:  Assessment/Plan:   Assessment Hypertensive urgency Cholecystitis Hyponatremia Acute kidney injury    Plan Ct diovan, amlodipine and carvedilol. Hold hct  Jeremiah Rivera/p lap chol Hyponatremia- ct ns  Hypokalemia-supplement AKI-ATN, although FeUrea is <35, give fluids as above. Nephrology following   Electronic Signatures: Charlesetta Garibaldiejan-Sie, Sheikh A (MD)  (Signed [phone]-Nov-13 16:53)  Authored: Chief Complaint, Assessment/Plan   Last Updated: 13-Nov-13 16:53 by Charlesetta Garibaldiejan-Sie, Sheikh A (MD)

## 2014-07-05 NOTE — Consult Note (Signed)
Chief Complaint:   Subjective/Chief Complaint Events of this AM reviewed. Now intubated in ICU and on pressors. Sedated and minimally responsive.   VITAL SIGNS/ANCILLARY NOTES: **Vital Signs.:   17-Nov-13 07:36   Pulse Pulse 86   Respirations Respirations 14   Systolic BP Systolic BP 427   Diastolic BP (mmHg) Diastolic BP (mmHg) 75   Mean BP 101   Pulse Ox % Pulse Ox % 89   Pulse Ox Heart Rate 86   Brief Assessment:   Cardiac Regular    Respiratory clear BS    Gastrointestinal distended with absent bowel sounds   Lab Results: Routine Chem:  17-Nov-13 07:46    Glucose, Serum  178   BUN  22   Creatinine (comp)  2.02   Sodium, Serum 137   Potassium, Serum 4.5   Chloride, Serum 105   CO2, Serum 22   Calcium (Total), Serum  7.7   Anion Gap 10   Osmolality (calc) 282   eGFR (African American)  37   eGFR (Non-African American)  32 (eGFR values <59m/min/1.73 m2 may be an indication of chronic kidney disease (CKD). Calculated eGFR is useful in patients with stable renal function. The eGFR calculation will not be reliable in acutely ill patients when serum creatinine is changing rapidly. It is not useful in  patients on dialysis. The eGFR calculation may not be applicable to patients at the low and high extremes of body sizes, pregnant women, and vegetarians.)   Result Comment labs - This specimen was collected through an   - indwelling catheter or arterial line.  - A minimum of 542m of blood was wasted prior    - to collecting the sample.  Interpret  - results with caution.  Result(s) reported on 02 Feb 2012 at 08:03AM.  Routine Hem:  17-Nov-13 07:46    WBC (CBC)  20.3   RBC (CBC)  3.44   Hemoglobin (CBC)  9.7   Hematocrit (CBC)  29.7   Platelet Count (CBC) 357   MCV 86   MCH 28.2   MCHC 32.7   RDW  16.0   Neutrophil % 80.8   Lymphocyte % 14.6   Monocyte % 2.8   Eosinophil % 1.1   Basophil % 0.7   Neutrophil #  16.3   Lymphocyte # 3.0   Monocyte # 0.6    Eosinophil # 0.2   Basophil # 0.1 (Result(s) reported on 02 Feb 2012 at 08:06AM.)   Assessment/Plan:  Assessment/Plan:   Assessment S/P resp/cardiac arrest. Now intubated. LFT better with stone extraction. Lipase minimally elevated.    Plan Order abd series to check for ileus, etc.   Electronic Signatures: OhVerdie ShireMD)  (Signed [phone])  Authored: Chief Complaint, VITAL SIGNS/ANCILLARY NOTES, Brief Assessment, Lab Results, Assessment/Plan   Last Updated: 17-Nov-13 09:24 by OhVerdie ShireMD)
